# Patient Record
Sex: Male | Born: 1945 | Race: White | Hispanic: No | Marital: Married | Smoking: Never smoker
Health system: Southern US, Community
[De-identification: ages and names within clinical notes are randomized; demographics above are authoritative.]

## PROBLEM LIST (undated history)

## (undated) DIAGNOSIS — M1990 Unspecified osteoarthritis, unspecified site: Secondary | ICD-10-CM

## (undated) DIAGNOSIS — C61 Malignant neoplasm of prostate: Secondary | ICD-10-CM

## (undated) DIAGNOSIS — K219 Gastro-esophageal reflux disease without esophagitis: Secondary | ICD-10-CM

## (undated) DIAGNOSIS — E039 Hypothyroidism, unspecified: Secondary | ICD-10-CM

## (undated) DIAGNOSIS — N401 Enlarged prostate with lower urinary tract symptoms: Secondary | ICD-10-CM

## (undated) DIAGNOSIS — C801 Malignant (primary) neoplasm, unspecified: Secondary | ICD-10-CM

## (undated) DIAGNOSIS — Z973 Presence of spectacles and contact lenses: Secondary | ICD-10-CM

## (undated) DIAGNOSIS — Z77098 Contact with and (suspected) exposure to other hazardous, chiefly nonmedicinal, chemicals: Secondary | ICD-10-CM

## (undated) DIAGNOSIS — I10 Essential (primary) hypertension: Secondary | ICD-10-CM

## (undated) DIAGNOSIS — Z974 Presence of external hearing-aid: Secondary | ICD-10-CM

## (undated) HISTORY — PX: SURGERY SCROTAL / TESTICULAR: SUR1316

## (undated) HISTORY — PX: TONSILLECTOMY: SUR1361

## (undated) HISTORY — PX: COLONOSCOPY: SHX174

---

## 1992-05-26 DIAGNOSIS — Z923 Personal history of irradiation: Secondary | ICD-10-CM

## 1992-05-26 DIAGNOSIS — Z8547 Personal history of malignant neoplasm of testis: Secondary | ICD-10-CM

## 1992-05-26 HISTORY — DX: Personal history of irradiation: Z92.3

## 1992-05-26 HISTORY — PX: RADICAL ORCHIECTOMY: SHX2285

## 1992-05-26 HISTORY — DX: Personal history of malignant neoplasm of testis: Z85.47

## 1999-05-21 ENCOUNTER — Encounter: Payer: Self-pay | Admitting: Family Medicine

## 1999-05-21 ENCOUNTER — Encounter: Admission: RE | Admit: 1999-05-21 | Discharge: 1999-05-21 | Payer: Self-pay | Admitting: Family Medicine

## 2001-06-01 ENCOUNTER — Encounter: Payer: Self-pay | Admitting: Family Medicine

## 2001-06-01 ENCOUNTER — Encounter: Admission: RE | Admit: 2001-06-01 | Discharge: 2001-06-01 | Payer: Self-pay | Admitting: Family Medicine

## 2003-04-05 ENCOUNTER — Encounter: Admission: RE | Admit: 2003-04-05 | Discharge: 2003-04-05 | Payer: Self-pay | Admitting: Family Medicine

## 2005-09-12 ENCOUNTER — Encounter: Admission: RE | Admit: 2005-09-12 | Discharge: 2005-09-12 | Payer: Self-pay | Admitting: Family Medicine

## 2009-11-08 ENCOUNTER — Encounter: Payer: Self-pay | Admitting: Emergency Medicine

## 2009-11-08 ENCOUNTER — Ambulatory Visit: Payer: Self-pay | Admitting: Diagnostic Radiology

## 2010-05-02 ENCOUNTER — Observation Stay (HOSPITAL_COMMUNITY): Admission: EM | Admit: 2010-05-02 | Discharge: 2009-11-09 | Payer: Self-pay

## 2010-08-11 LAB — COMPREHENSIVE METABOLIC PANEL
ALT: 66 U/L — ABNORMAL HIGH (ref 0–53)
AST: 39 U/L — ABNORMAL HIGH (ref 0–37)
Albumin: 4 g/dL (ref 3.5–5.2)
Alkaline Phosphatase: 56 U/L (ref 39–117)
CO2: 26 mEq/L (ref 19–32)
Chloride: 105 mEq/L (ref 96–112)
Creatinine, Ser: 0.8 mg/dL (ref 0.4–1.5)
GFR calc Af Amer: >60 mL/min (ref >60)
GFR calc non Af Amer: >60 mL/min (ref >60)
Potassium: 3.5 mEq/L (ref 3.5–5.1)
Sodium: 143 mEq/L (ref 135–145)
Total Bilirubin: 0.5 mg/dL (ref 0.3–1.2)

## 2010-08-11 LAB — DIFFERENTIAL
Basophils Absolute: 0 10*3/uL (ref 0.0–0.1)
Eosinophils Absolute: 0.1 10*3/uL (ref 0.0–0.7)
Eosinophils Relative: 1 % (ref 0–5)
Lymphocytes Relative: 30 % (ref 12–46)
Monocytes Absolute: 0.6 10*3/uL (ref 0.1–1.0)

## 2010-08-11 LAB — CBC
HCT: 39.8 % (ref 39.0–52.0)
Hemoglobin: 14 g/dL (ref 13.0–17.0)
MCHC: 35 g/dL (ref 30.0–36.0)
MCV: 89.8 fL (ref 78.0–100.0)
Platelets: 151 10*3/uL (ref 150–400)
RBC: 4.44 MIL/uL (ref 4.22–5.81)
RDW: 12.6 % (ref 11.5–15.5)
WBC: 7.3 10*3/uL (ref 4.0–10.5)

## 2011-05-23 IMAGING — CT CT HEAD W/O CM
1 series · 16 of 30 positions shown, 20 images · non-contrast
Comparison: None

CLINICAL DATA: Dizziness, ringing in left ear, hypertension

CT HEAD WITHOUT CONTRAST
TECHNIQUE: Contiguous axial images were obtained from the base of
the skull through the vertex without contrast.

[Series 2: head 4.8 h37s · axial · 0.47mm/px · z∈[-120,+40]mm · 16 of 36 slices shown, 20 images]
[im 2/36  brain]
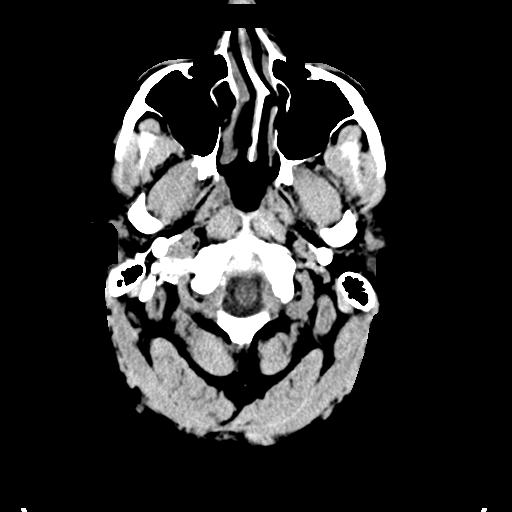
[im 2/36  bone]
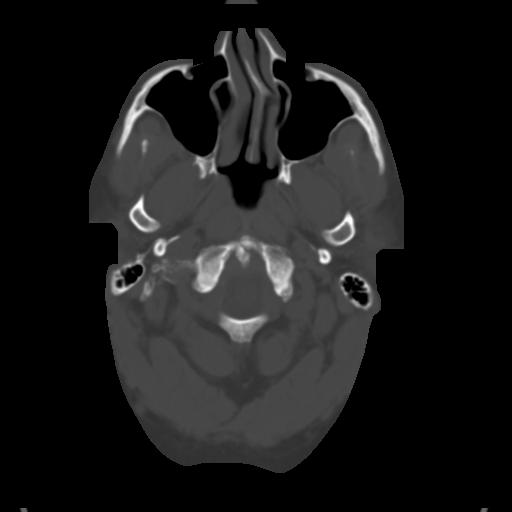
[im 4/36  brain]
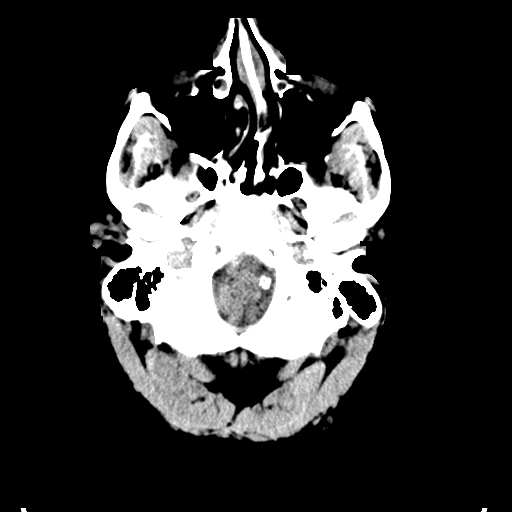
[im 7/36  brain]
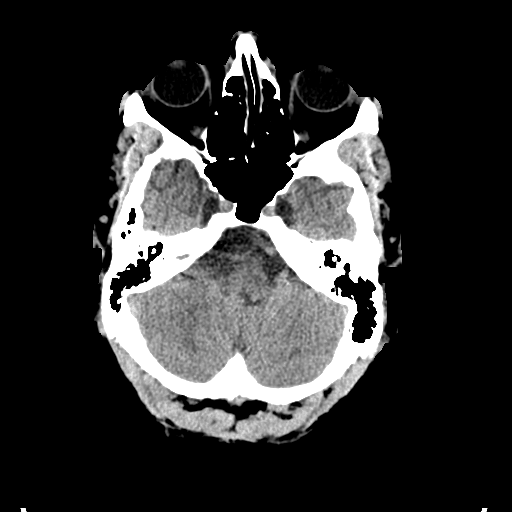
[im 9/36  brain]
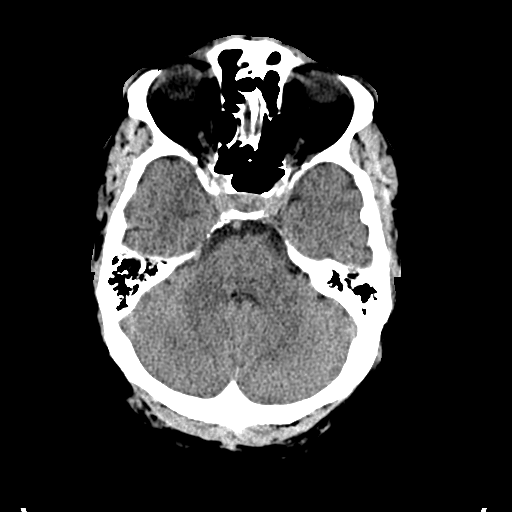
[im 10/36  brain]
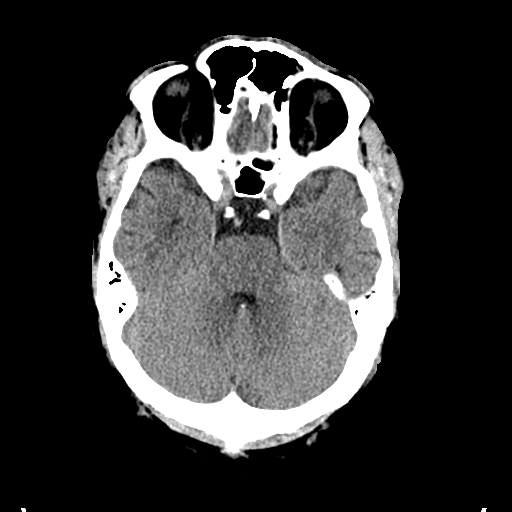
[im 10/36  bone]
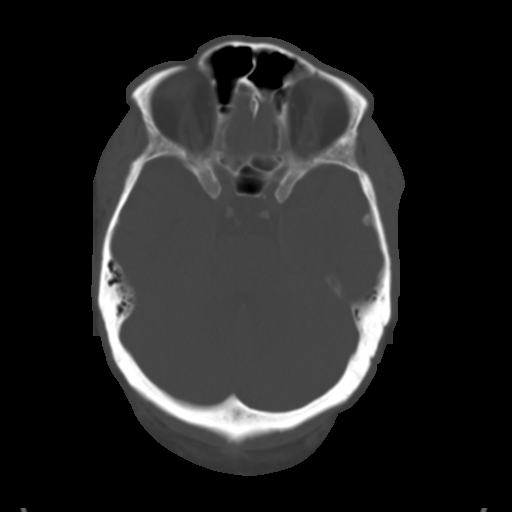
[im 13/36  brain]
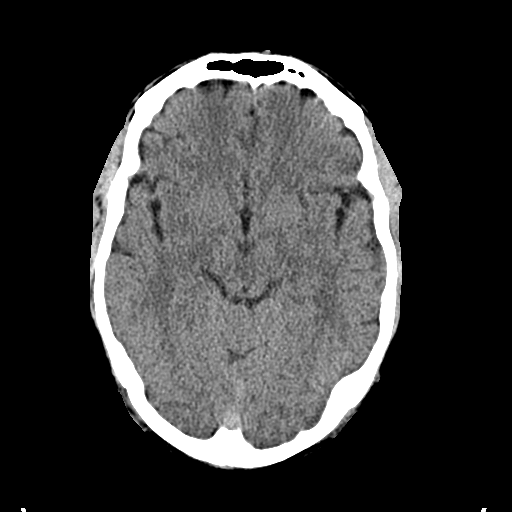
[im 15/36  brain]
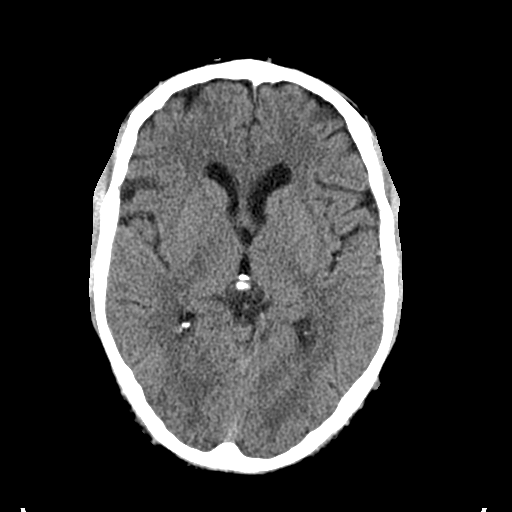
[im 17/36  brain]
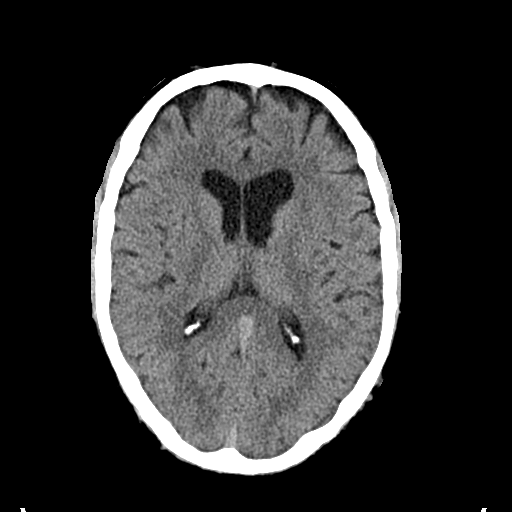
[im 19/36  brain]
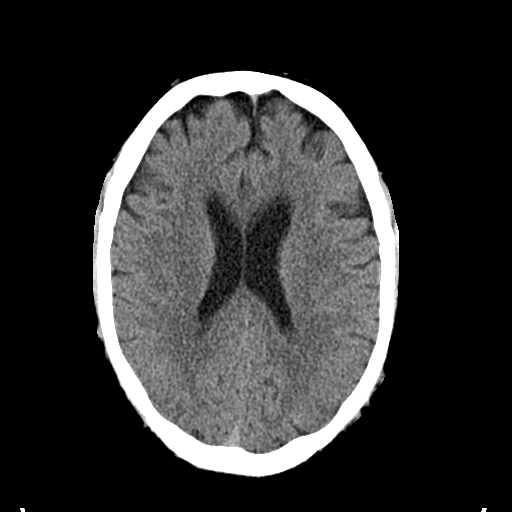
[im 19/36  bone]
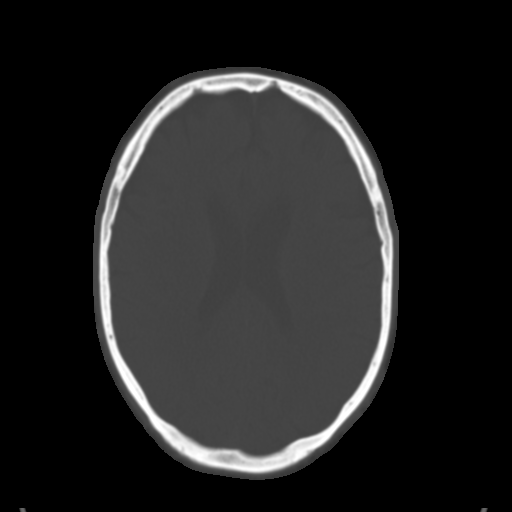
[im 21/36  brain]
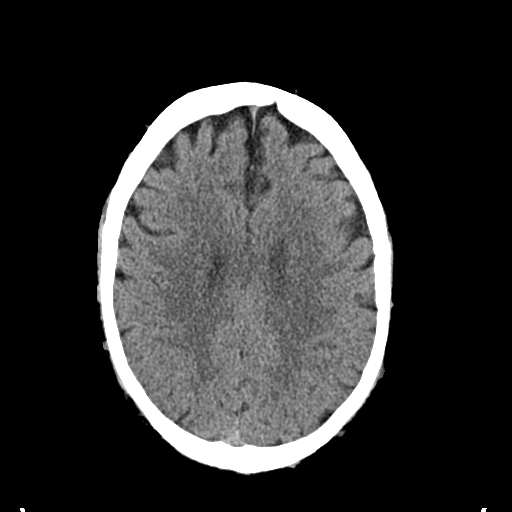
[im 23/36  brain]
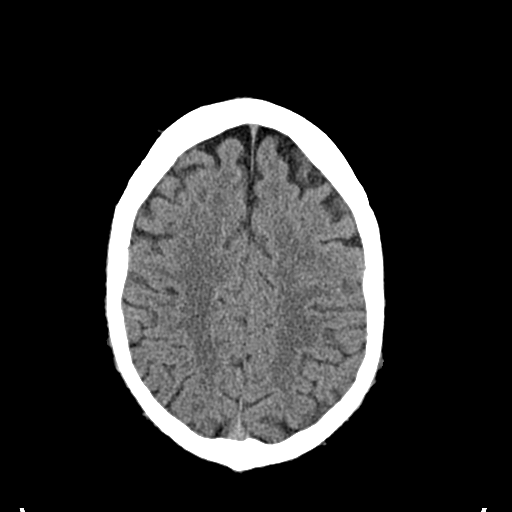
[im 26/36  brain]
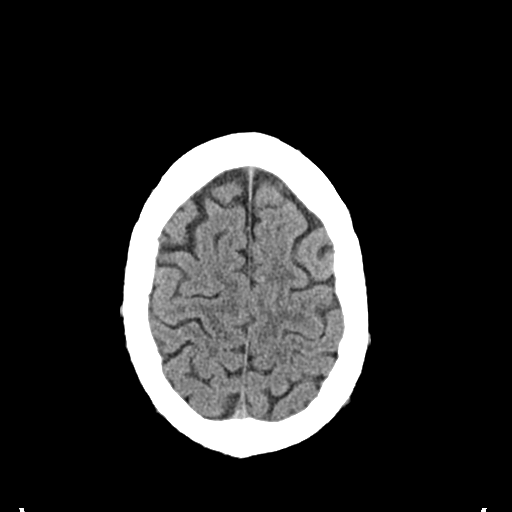
[im 27/36  brain]
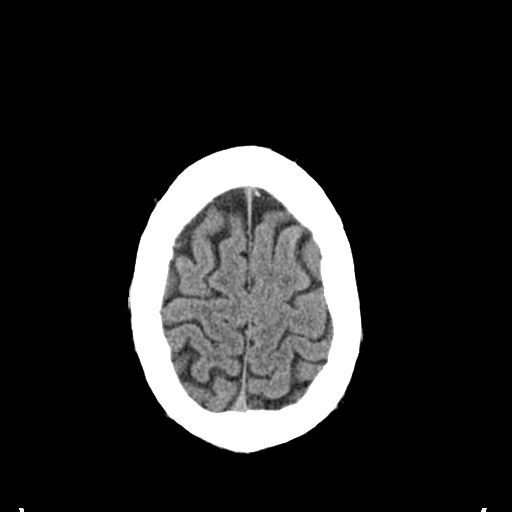
[im 27/36  bone]
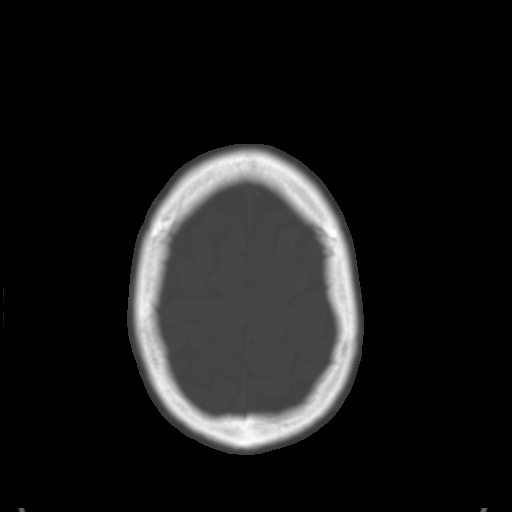
[im 29/36  brain]
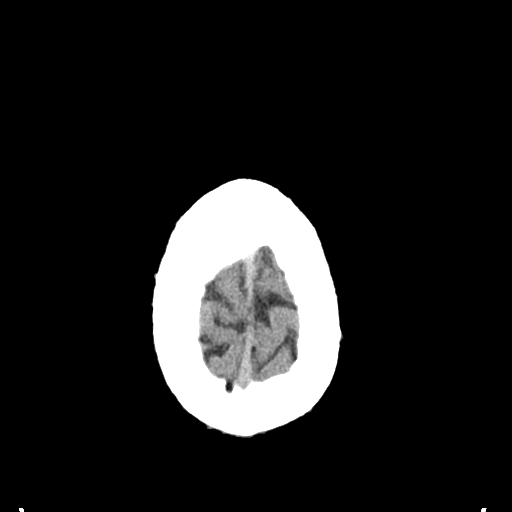
[im 32/36  brain]
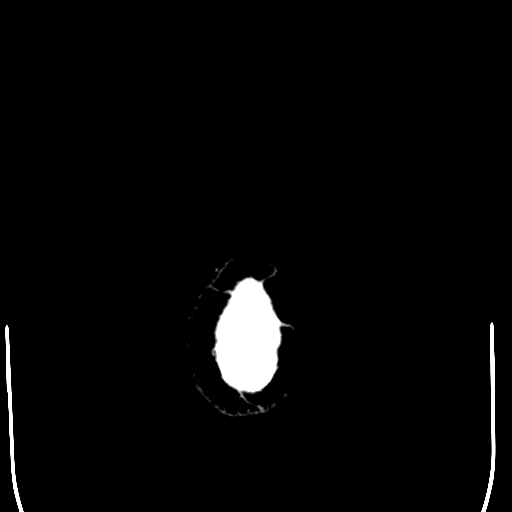
[im 34/36  brain]
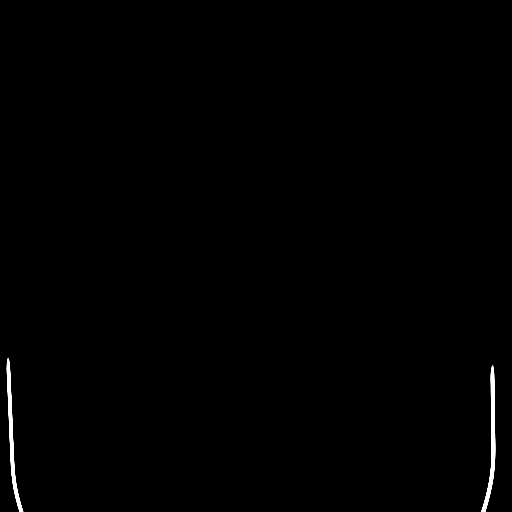

[16 of 30 positions shown; findings below may reference images not displayed]

FINDINGS: Generalized atrophy.
Normal ventricular morphology.
No midline shift or mass effect.
Otherwise normal appearance of brain parenchyma.
No intracranial hemorrhage, mass lesion, or evidence of acute
infarction.
No definite extra-axial fluid collections.
Atherosclerotic calcifications of vertebral arteries at skull base.
Visualized paranasal sinuses and mastoid air cells clear.
No acute bony findings.
IMPRESSION: Generalized atrophy.
No acute intracranial abnormalities.

## 2014-05-26 DIAGNOSIS — Z8711 Personal history of peptic ulcer disease: Secondary | ICD-10-CM

## 2014-05-26 HISTORY — PX: ESOPHAGOGASTRODUODENOSCOPY: SHX1529

## 2014-05-26 HISTORY — DX: Personal history of peptic ulcer disease: Z87.11

## 2019-10-12 DIAGNOSIS — E785 Hyperlipidemia, unspecified: Secondary | ICD-10-CM | POA: Diagnosis present

## 2019-10-12 DIAGNOSIS — Z8711 Personal history of peptic ulcer disease: Secondary | ICD-10-CM

## 2019-10-12 DIAGNOSIS — M1611 Unilateral primary osteoarthritis, right hip: Secondary | ICD-10-CM | POA: Diagnosis present

## 2019-10-12 DIAGNOSIS — M1712 Unilateral primary osteoarthritis, left knee: Secondary | ICD-10-CM | POA: Diagnosis present

## 2019-10-12 DIAGNOSIS — Z8547 Personal history of malignant neoplasm of testis: Secondary | ICD-10-CM

## 2019-10-12 DIAGNOSIS — I10 Essential (primary) hypertension: Secondary | ICD-10-CM | POA: Diagnosis present
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
## 2019-10-19 NOTE — Progress Notes (Addendum)
PCP - Dr. Norvel [facility] w/ surgical clearance dated 10-21-19 on chart. Cardiologist -   Chest x-ray -  EKG -  Stress Test -  ECHO -  Cardiac Cath -   Sleep Study -  CPAP -   Fasting Blood Sugar -  Checks Blood Sugar _____ times a day  Blood Thinner Instructions: Aspirin Instructions: Last Dose:  Pt  has had both of his vaccinations    Anesthesia review:   Patient denies shortness of breath, fever, cough and chest pain at PAT appointment   Patient verbalized understanding of instructions that were given to them at the PAT appointment. Patient was also instructed that they will need to review over the PAT instructions again at home before surgery.

## 2019-10-19 NOTE — Patient Instructions (Addendum)
DUE TO COVID-19 ONLY ONE VISITOR IS ALLOWED TO COME WITH YOU AND STAY IN THE WAITING ROOM ONLY DURING PRE OP AND PROCEDURE DAY OF SURGERY. THE 1 VISITOR MAY VISIT WITH YOU AFTER SURGERY IN YOUR PRIVATE ROOM DURING VISITING HOURS ONLY!  YOU NEED TO HAVE A COVID 19 TEST ON 10-28-19 @ 10:05 AM, THIS TEST MUST BE DONE BEFORE SURGERY, COME  [address], [place] , [id number].  ([place]) ONCE YOUR COVID TEST IS COMPLETED, PLEASE BEGIN THE QUARANTINE INSTRUCTIONS AS OUTLINED IN YOUR HANDOUT.                DONTRA STONEKING  10/19/2019   Your procedure is scheduled on: 11-01-19   Report to [facility] Main  Entrance    Report to Admitting at 5:30 AM     Call this number if you have problems the morning of surgery [phone]    Remember: AFTER MIDNIGHT THE NIGHT PRIOR TO SURGERY. NOTHING BY MOUTH EXCEPT CLEAR LIQUIDS UNTIL 4:30 AM . PLEASE FINISH ENSURE DRINK PER SURGEON ORDER  WHICH NEEDS TO BE COMPLETED AT 4:30 AM .   CLEAR LIQUID DIET   Foods Allowed                                                                     Foods Excluded  Coffee and tea, regular and decaf                             liquids that you cannot  Plain Jell-O any favor except red or purple                                           see through such as: Fruit ices (not with fruit pulp)                                     milk, soups, orange juice  Iced Popsicles                                    All solid food Carbonated beverages, regular and diet                                    Cranberry, grape and apple juices Sports drinks like Gatorade Lightly seasoned clear broth or consume(fat free) Sugar, honey syrup   _____________________________________________________________________      Take these medicines the morning of surgery with A SIP OF WATER: Amlodipine (Norvasc), Levothyroxine (Synthroid), and Metoprolol Succinate   BRUSH YOUR TEETH MORNING OF SURGERY AND RINSE YOUR  MOUTH OUT, NO CHEWING GUM CANDY OR MINTS.                                You may not have any metal on  your body including hair pins and              piercings     Do not wear jewelry, cologne, lotions, powders or deodorant              Men may shave face and neck.   Do not bring valuables to the hospital. [place].  Contacts, dentures or bridgework may not be worn into surgery.  You may bring a small overnight bag     Special Instructions: N/A              Please read over the following fact sheets you were given: _____________________________________________________________________             [facility] - Preparing for Surgery Before surgery, you can play an important role.  Because skin is not sterile, your skin needs to be as free of germs as possible.  You can reduce the number of germs on your skin by washing with CHG (chlorahexidine gluconate) soap before surgery.  CHG is an antiseptic cleaner which kills germs and bonds with the skin to continue killing germs even after washing. Please DO NOT use if you have an allergy to CHG or antibacterial soaps.  If your skin becomes reddened/irritated stop using the CHG and inform your nurse when you arrive at Short Stay. Do not shave (including legs and underarms) for at least 48 hours prior to the first CHG shower.  You may shave your face/neck. Please follow these instructions carefully:  1.  Shower with CHG Soap the night before surgery and the  morning of Surgery.  2.  If you choose to wash your hair, wash your hair first as usual with your  normal  shampoo.  3.  After you shampoo, rinse your hair and body thoroughly to remove the  shampoo.                           4.  Use CHG as you would any other liquid soap.  You can apply chg directly  to the skin and wash                       Gently with a scrungie or clean washcloth.  5.  Apply the CHG Soap to your body ONLY FROM THE NECK  DOWN.   Do not use on face/ open                           Wound or open sores. Avoid contact with eyes, ears mouth and genitals (private parts).                       Wash face,  Genitals (private parts) with your normal soap.             6.  Wash thoroughly, paying special attention to the area where your surgery  will be performed.  7.  Thoroughly rinse your body with warm water from the neck down.  8.  DO NOT shower/wash with your normal soap after using and rinsing off  the CHG Soap.                9.  Pat yourself dry with a clean towel.  10.  Wear clean pajamas.            11.  Place clean sheets on your bed the night of your first shower and do not  sleep with pets. Day of Surgery : Do not apply any lotions/deodorants the morning of surgery.  Please wear clean clothes to the hospital/surgery center.  FAILURE TO FOLLOW THESE INSTRUCTIONS [place] RESULT IN THE CANCELLATION OF YOUR SURGERY PATIENT SIGNATURE_________________________________  NURSE SIGNATURE__________________________________  ________________________________________________________________________   Adam Phenix  An incentive spirometer is a tool that can help keep your lungs clear and active. This tool measures how well you are filling your lungs with each breath. Taking long deep breaths Widjaja help reverse or decrease the chance of developing breathing (pulmonary) problems (especially infection) following:  A long period of time when you are unable to move or be active. BEFORE THE PROCEDURE   If the spirometer includes an indicator to show your best effort, your nurse or respiratory therapist will set it to a desired goal.  If possible, sit up straight or lean slightly forward. Try not to slouch.  Hold the incentive spirometer in an upright position. INSTRUCTIONS FOR USE  1. Sit on the edge of your bed if possible, or sit up as far as you can in bed or on a chair. 2. Hold the incentive spirometer in  an upright position. 3. Breathe out normally. 4. Place the mouthpiece in your mouth and seal your lips tightly around it. 5. Breathe in slowly and as deeply as possible, raising the piston or the ball toward the top of the column. 6. Hold your breath for 3-5 seconds or for as long as possible. Allow the piston or ball to fall to the bottom of the column. 7. Remove the mouthpiece from your mouth and breathe out normally. 8. Rest for a few seconds and repeat Steps 1 through 7 at least 10 times every 1-2 hours when you are awake. Take your time and take a few normal breaths between deep breaths. 9. The spirometer Annunziato include an indicator to show your best effort. Use the indicator as a goal to work toward during each repetition. 10. After each set of 10 deep breaths, practice coughing to be sure your lungs are clear. If you have an incision (the cut made at the time of surgery), support your incision when coughing by placing a pillow or rolled up towels firmly against it. Once you are able to get out of bed, walk around indoors and cough well. You [place] stop using the incentive spirometer when instructed by your caregiver.  RISKS AND COMPLICATIONS  Take your time so you do not get dizzy or light-headed.  If you are in pain, you Sh[place]ido need to take or ask for pain medication before doing incentive spirometry. It is harder to take a deep breath if you are having pain. AFTER USE  Rest and breathe slowly and easily.  It can be helpful to keep track of a log of your progress. Your caregiver can provide you with a simple table to help with this. If you are using the spirometer at home, follow these instructions: Julian IF:   You are having difficultly using the spirometer.  You have trouble using the spirometer as often as instructed.  Your pain medication is not giving enough relief while using the spirometer.  You develop fever of 100.5 F (38.1 C) or higher. SEEK IMMEDIATE MEDICAL CARE  IF:   You cough up bloody  sputum that had not been present before.  You develop fever of 102 F (38.9 C) or greater.  You develop worsening pain at or near the incision site. MAKE SURE YOU:   Understand these instructions.  Will watch your condition.  Will get help right away if you are not doing well or get worse. Document Released: 09/22/2006 Document Revised: 08/04/2011 Document Reviewed: 11/23/2006 [facility] Patient Information 2014 [place], [place].   ________________________________________________________________________

## 2019-10-26 ENCOUNTER — Other Ambulatory Visit: Payer: Self-pay

## 2019-10-26 ENCOUNTER — Encounter (HOSPITAL_COMMUNITY)
Admission: RE | Admit: 2019-10-26 | Discharge: 2019-10-26 | Disposition: A | Discharge status: Home / Self Care | Payer: Medicare Other | Source: Ambulatory Visit | Attending: Orthopedic Surgery | Admitting: Orthopedic Surgery

## 2019-10-26 ENCOUNTER — Encounter (HOSPITAL_COMMUNITY): Payer: Self-pay

## 2019-10-26 DIAGNOSIS — Z01818 Encounter for other preprocedural examination: Secondary | ICD-10-CM | POA: Insufficient documentation

## 2019-10-26 HISTORY — DX: Essential (primary) hypertension: I10

## 2019-10-26 HISTORY — DX: Malignant (primary) neoplasm, unspecified: C80.1

## 2019-10-26 HISTORY — DX: Gastro-esophageal reflux disease without esophagitis: K21.9

## 2019-10-26 HISTORY — DX: Unspecified osteoarthritis, unspecified site: M19.90

## 2019-10-26 LAB — SURGICAL PCR SCREEN
MRSA, PCR: NEGATIVE
Staphylococcus aureus: NEGATIVE

## 2019-10-26 LAB — CBC
HCT: 43.5 % (ref 39.0–52.0)
Hemoglobin: 14.6 g/dL (ref 13.0–17.0)
MCH: 30.9 pg (ref 26.0–34.0)
MCHC: 33.6 g/dL (ref 30.0–36.0)
MCV: 92.2 fL (ref 80.0–100.0)
Platelets: 157 10*3/uL (ref 150–400)
RBC: 4.72 MIL/uL (ref 4.22–5.81)
RDW: 13.9 % (ref 11.5–15.5)
WBC: 6.6 10*3/uL (ref 4.0–10.5)
nRBC: 0 % (ref 0.0–0.2)

## 2019-10-26 LAB — BASIC METABOLIC PANEL
Anion gap: 10 (ref 5–15)
BUN: 18 mg/dL (ref 8–23)
CO2: 26 mmol/L (ref 22–32)
Calcium: 9.3 mg/dL (ref 8.9–10.3)
Chloride: 100 mmol/L (ref 98–111)
Creatinine, Ser: 0.94 mg/dL (ref 0.61–1.24)
GFR calc Af Amer: >60 mL/min (ref >60)
GFR calc non Af Amer: >60 mL/min (ref >60)
Glucose, Bld: 99 mg/dL (ref 70–99)
Potassium: 4.4 mmol/L (ref 3.5–5.1)
Sodium: 136 mmol/L (ref 135–145)

## 2019-10-28 ENCOUNTER — Other Ambulatory Visit (HOSPITAL_COMMUNITY)
Admission: RE | Admit: 2019-10-28 | Discharge: 2019-10-28 | Disposition: A | Discharge status: Home / Self Care | Payer: Medicare Other | Source: Ambulatory Visit | Attending: Orthopedic Surgery | Admitting: Orthopedic Surgery

## 2019-10-28 DIAGNOSIS — Z01812 Encounter for preprocedural laboratory examination: Secondary | ICD-10-CM | POA: Diagnosis present

## 2019-10-28 DIAGNOSIS — Z20822 Contact with and (suspected) exposure to covid-19: Secondary | ICD-10-CM | POA: Insufficient documentation

## 2019-10-29 LAB — SARS CORONAVIRUS 2 (TAT 6-24 HRS): SARS Coronavirus 2: NEGATIVE

## 2019-10-31 MED ORDER — BUPIVACAINE LIPOSOME 1.3 % IJ SUSP
10.0000 mL | Freq: Once | INTRAMUSCULAR | Status: DC
  Filled 2019-10-31: qty 10

## 2019-10-31 NOTE — Anesthesia Preprocedure Evaluation (Addendum)
Anesthesia Evaluation  Patient identified by MRN, date of birth, ID band Patient awake    Reviewed: Allergy & Precautions, NPO status , Patient's Chart, lab work & pertinent test results, reviewed documented beta blocker date and time   Airway Mallampati: II  TM Distance: >3 FB Neck ROM: Full    Dental no notable dental hx. (+) Dental Advisory Given, Teeth Intact   Pulmonary neg pulmonary ROS,    Pulmonary exam normal breath sounds clear to auscultation       Cardiovascular hypertension, Pt. on medications and Pt. on home beta blockers Normal cardiovascular exam Rhythm:Regular Rate:Normal     Neuro/Psych negative neurological ROS  negative psych ROS   GI/Hepatic Neg liver ROS, GERD  ,  Endo/Other  Morbid obesity  Renal/GU negative Renal ROS     Musculoskeletal  (+) Arthritis ,   Abdominal   Peds  Hematology negative hematology ROS (+)   Anesthesia Other Findings   Reproductive/Obstetrics negative OB ROS                            Anesthesia Physical Anesthesia Plan  ASA: III  Anesthesia Plan: Spinal   Post-op Pain Management:    Induction: Intravenous  PONV Risk Score and Plan: 2 and Propofol infusion, TIVA and Treatment may vary due to age or medical condition  Airway Management Planned: Natural Airway  Additional Equipment: None  Intra-op Plan:   Post-operative Plan:   Informed Consent: I have reviewed the patients History and Physical, chart, labs and discussed the procedure including the risks, benefits and alternatives for the proposed anesthesia with the patient or authorized representative who has indicated his/her understanding and acceptance.     Dental advisory given  Plan Discussed with: CRNA  Anesthesia Plan Comments:        Anesthesia Quick Evaluation

## 2019-11-01 ENCOUNTER — Other Ambulatory Visit: Payer: Self-pay

## 2019-11-01 ENCOUNTER — Ambulatory Visit (HOSPITAL_COMMUNITY): Payer: Medicare Other

## 2019-11-01 ENCOUNTER — Encounter (HOSPITAL_COMMUNITY)
Admission: RE | Disposition: A | Discharge status: Home / Self Care | Payer: Self-pay | Source: Home / Self Care | Attending: Orthopedic Surgery

## 2019-11-01 ENCOUNTER — Observation Stay (HOSPITAL_COMMUNITY)
Admission: RE | Admit: 2019-11-01 | Discharge: 2019-11-02 | Disposition: A | Discharge status: Home / Self Care | Payer: Medicare Other | Attending: Orthopedic Surgery | Admitting: Orthopedic Surgery

## 2019-11-01 ENCOUNTER — Ambulatory Visit (HOSPITAL_COMMUNITY): Payer: Medicare Other | Admitting: Anesthesiology

## 2019-11-01 ENCOUNTER — Encounter (HOSPITAL_COMMUNITY): Payer: Self-pay | Admitting: Orthopedic Surgery

## 2019-11-01 DIAGNOSIS — Z79899 Other long term (current) drug therapy: Secondary | ICD-10-CM | POA: Insufficient documentation

## 2019-11-01 DIAGNOSIS — Z419 Encounter for procedure for purposes other than remedying health state, unspecified: Secondary | ICD-10-CM

## 2019-11-01 DIAGNOSIS — M1712 Unilateral primary osteoarthritis, left knee: Secondary | ICD-10-CM | POA: Diagnosis not present

## 2019-11-01 DIAGNOSIS — I10 Essential (primary) hypertension: Secondary | ICD-10-CM | POA: Diagnosis not present

## 2019-11-01 DIAGNOSIS — E785 Hyperlipidemia, unspecified: Secondary | ICD-10-CM | POA: Diagnosis present

## 2019-11-01 DIAGNOSIS — K219 Gastro-esophageal reflux disease without esophagitis: Secondary | ICD-10-CM | POA: Diagnosis not present

## 2019-11-01 DIAGNOSIS — Z8547 Personal history of malignant neoplasm of testis: Secondary | ICD-10-CM | POA: Diagnosis not present

## 2019-11-01 DIAGNOSIS — M1611 Unilateral primary osteoarthritis, right hip: Secondary | ICD-10-CM | POA: Diagnosis present

## 2019-11-01 DIAGNOSIS — Z79890 Hormone replacement therapy (postmenopausal): Secondary | ICD-10-CM | POA: Insufficient documentation

## 2019-11-01 DIAGNOSIS — E039 Hypothyroidism, unspecified: Secondary | ICD-10-CM | POA: Diagnosis not present

## 2019-11-01 DIAGNOSIS — M1610 Unilateral primary osteoarthritis, unspecified hip: Secondary | ICD-10-CM | POA: Diagnosis present

## 2019-11-01 DIAGNOSIS — Z8711 Personal history of peptic ulcer disease: Secondary | ICD-10-CM

## 2019-11-01 DIAGNOSIS — Z6836 Body mass index (BMI) 36.0-36.9, adult: Secondary | ICD-10-CM | POA: Diagnosis not present

## 2019-11-01 HISTORY — PX: TOTAL HIP ARTHROPLASTY: SHX124

## 2019-11-01 HISTORY — PX: INJECTION KNEE: SHX2446

## 2019-11-01 SURGERY — ARTHROPLASTY, HIP, TOTAL, ANTERIOR APPROACH
Anesthesia: Spinal | Site: Knee | Laterality: Right

## 2019-11-01 MED ORDER — MORPHINE SULFATE (PF) 4 MG/ML IV SOLN: 0.5000 mg | INTRAVENOUS | Status: DC | PRN

## 2019-11-01 MED ORDER — BACLOFEN 10 MG PO TABS: 10.0000 mg | ORAL_TABLET | Freq: Three times a day (TID) | ORAL | 0 refills | Status: DC | PRN

## 2019-11-01 MED ORDER — METHYLPREDNISOLONE ACETATE 40 MG/ML IJ SUSP
INTRAMUSCULAR | Status: DC | PRN
  Administered 2019-11-01: 80 mg via INTRA_ARTICULAR

## 2019-11-01 MED ORDER — ORAL CARE MOUTH RINSE: 15.0000 mL | Freq: Once | OROMUCOSAL | Status: AC

## 2019-11-01 MED ORDER — ONDANSETRON HCL 4 MG/2ML IJ SOLN
INTRAMUSCULAR | Status: DC | PRN
  Administered 2019-11-01: 4 mg via INTRAVENOUS

## 2019-11-01 MED ORDER — DEXAMETHASONE SODIUM PHOSPHATE 10 MG/ML IJ SOLN
10.0000 mg | Freq: Once | INTRAMUSCULAR | Status: AC
  Administered 2019-11-02: 10 mg via INTRAVENOUS
  Filled 2019-11-01: qty 1

## 2019-11-01 MED ORDER — CEFAZOLIN SODIUM-DEXTROSE 2-4 GM/100ML-% IV SOLN
2.0000 g | INTRAVENOUS | Status: AC
  Administered 2019-11-01: 2 g via INTRAVENOUS
  Filled 2019-11-01: qty 100

## 2019-11-01 MED ORDER — LACTATED RINGERS IV SOLN: INTRAVENOUS | Status: DC

## 2019-11-01 MED ORDER — POLYETHYLENE GLYCOL 3350 17 G PO PACK: 17.0000 g | PACK | Freq: Every day | ORAL | Status: DC | PRN

## 2019-11-01 MED ORDER — KETOROLAC TROMETHAMINE 15 MG/ML IJ SOLN
INTRAMUSCULAR | Status: AC
  Filled 2019-11-01: qty 1

## 2019-11-01 MED ORDER — MORPHINE SULFATE (PF) 2 MG/ML IV SOLN: 0.5000 mg | INTRAVENOUS | Status: DC | PRN

## 2019-11-01 MED ORDER — BUPIVACAINE HCL (PF) 0.25 % IJ SOLN
INTRAMUSCULAR | Status: DC | PRN
  Administered 2019-11-01: 3 mL via INTRA_ARTICULAR

## 2019-11-01 MED ORDER — PROPOFOL 10 MG/ML IV BOLUS
INTRAVENOUS | Status: DC | PRN
  Administered 2019-11-01: 40 mg via INTRAVENOUS

## 2019-11-01 MED ORDER — ATORVASTATIN CALCIUM 20 MG PO TABS: 20.0000 mg | ORAL_TABLET | ORAL | Status: DC

## 2019-11-01 MED ORDER — ACETAMINOPHEN 500 MG PO TABS
500.0000 mg | ORAL_TABLET | Freq: Four times a day (QID) | ORAL | Status: AC
  Administered 2019-11-01 – 2019-11-02 (×2): 500 mg via ORAL
  Filled 2019-11-01 (×2): qty 1

## 2019-11-01 MED ORDER — METHYLPREDNISOLONE ACETATE 40 MG/ML IJ SUSP
INTRAMUSCULAR | Status: AC
  Filled 2019-11-01: qty 2

## 2019-11-01 MED ORDER — CHLORHEXIDINE GLUCONATE 0.12 % MT SOLN
15.0000 mL | Freq: Once | OROMUCOSAL | Status: AC
  Administered 2019-11-01: 15 mL via OROMUCOSAL

## 2019-11-01 MED ORDER — ACETAMINOPHEN 325 MG PO TABS: 325.0000 mg | ORAL_TABLET | Freq: Four times a day (QID) | ORAL | Status: DC | PRN

## 2019-11-01 MED ORDER — BUPIVACAINE IN DEXTROSE 0.75-8.25 % IT SOLN
INTRATHECAL | Status: DC | PRN
  Administered 2019-11-01: 1.8 mL via INTRATHECAL

## 2019-11-01 MED ORDER — DEXAMETHASONE SODIUM PHOSPHATE 10 MG/ML IJ SOLN
INTRAMUSCULAR | Status: DC | PRN
  Administered 2019-11-01: 8 mg via INTRAVENOUS

## 2019-11-01 MED ORDER — HYDROCODONE-ACETAMINOPHEN 5-325 MG PO TABS
1.0000 | ORAL_TABLET | ORAL | Status: DC | PRN
  Administered 2019-11-01 – 2019-11-02 (×2): 2 via ORAL
  Filled 2019-11-01 (×3): qty 2

## 2019-11-01 MED ORDER — PHENYLEPHRINE HCL (PRESSORS) 10 MG/ML IV SOLN
INTRAVENOUS | Status: AC
  Filled 2019-11-01: qty 1

## 2019-11-01 MED ORDER — HYDROCODONE-ACETAMINOPHEN 5-325 MG PO TABS
ORAL_TABLET | ORAL | Status: AC
  Filled 2019-11-01: qty 1

## 2019-11-01 MED ORDER — ASPIRIN EC 81 MG PO TBEC
81.0000 mg | DELAYED_RELEASE_TABLET | Freq: Two times a day (BID) | ORAL | 0 refills | Status: DC
Start: 2019-11-01 — End: 2019-11-02

## 2019-11-01 MED ORDER — ONDANSETRON HCL 4 MG PO TABS: 4.0000 mg | ORAL_TABLET | Freq: Three times a day (TID) | ORAL | 0 refills | Status: DC | PRN

## 2019-11-01 MED ORDER — POVIDONE-IODINE 10 % EX SWAB
2.0000 "application " | Freq: Once | CUTANEOUS | Status: AC
  Administered 2019-11-01: 2 via TOPICAL

## 2019-11-01 MED ORDER — PHENYLEPHRINE 40 MCG/ML (10ML) SYRINGE FOR IV PUSH (FOR BLOOD PRESSURE SUPPORT)
PREFILLED_SYRINGE | INTRAVENOUS | Status: DC | PRN
  Administered 2019-11-01 (×2): 80 ug via INTRAVENOUS

## 2019-11-01 MED ORDER — LOSARTAN POTASSIUM 50 MG PO TABS
50.0000 mg | ORAL_TABLET | Freq: Every day | ORAL | Status: DC
  Administered 2019-11-01 – 2019-11-02 (×2): 50 mg via ORAL
  Filled 2019-11-01 (×2): qty 1

## 2019-11-01 MED ORDER — PHENYLEPHRINE 40 MCG/ML (10ML) SYRINGE FOR IV PUSH (FOR BLOOD PRESSURE SUPPORT)
PREFILLED_SYRINGE | INTRAVENOUS | Status: AC
  Filled 2019-11-01: qty 10

## 2019-11-01 MED ORDER — SODIUM CHLORIDE (PF) 0.9 % IJ SOLN
INTRAMUSCULAR | Status: AC
  Filled 2019-11-01: qty 20

## 2019-11-01 MED ORDER — ALUM & MAG HYDROXIDE-SIMETH 200-200-20 MG/5ML PO SUSP: 30.0000 mL | ORAL | Status: DC | PRN

## 2019-11-01 MED ORDER — DOCUSATE SODIUM 100 MG PO CAPS
100.0000 mg | ORAL_CAPSULE | Freq: Two times a day (BID) | ORAL | Status: DC
  Administered 2019-11-01 – 2019-11-02 (×2): 100 mg via ORAL
  Filled 2019-11-01 (×2): qty 1

## 2019-11-01 MED ORDER — SODIUM CHLORIDE FLUSH 0.9 % IV SOLN
INTRAVENOUS | Status: DC | PRN
  Administered 2019-11-01: 20 mL via INTRAVENOUS

## 2019-11-01 MED ORDER — METOCLOPRAMIDE HCL 5 MG PO TABS
5.0000 mg | ORAL_TABLET | Freq: Three times a day (TID) | ORAL | Status: DC | PRN
  Filled 2019-11-01: qty 2

## 2019-11-01 MED ORDER — DEXAMETHASONE SODIUM PHOSPHATE 10 MG/ML IJ SOLN
INTRAMUSCULAR | Status: AC
  Filled 2019-11-01: qty 1

## 2019-11-01 MED ORDER — HYDROCODONE-ACETAMINOPHEN 5-325 MG PO TABS: 1.0000 | ORAL_TABLET | Freq: Four times a day (QID) | ORAL | 0 refills | Status: DC | PRN

## 2019-11-01 MED ORDER — PHENOL 1.4 % MT LIQD: 1.0000 | OROMUCOSAL | Status: DC | PRN

## 2019-11-01 MED ORDER — BUPIVACAINE HCL (PF) 0.25 % IJ SOLN
INTRAMUSCULAR | Status: AC
  Filled 2019-11-01: qty 30

## 2019-11-01 MED ORDER — DIPHENHYDRAMINE HCL 12.5 MG/5ML PO ELIX: 12.5000 mg | ORAL_SOLUTION | ORAL | Status: DC | PRN

## 2019-11-01 MED ORDER — HYDROCHLOROTHIAZIDE 25 MG PO TABS
25.0000 mg | ORAL_TABLET | Freq: Every day | ORAL | Status: DC
  Administered 2019-11-02: 25 mg via ORAL
  Filled 2019-11-01: qty 1

## 2019-11-01 MED ORDER — KETOROLAC TROMETHAMINE 15 MG/ML IJ SOLN
7.5000 mg | Freq: Four times a day (QID) | INTRAMUSCULAR | Status: AC
  Administered 2019-11-01 – 2019-11-02 (×4): 7.5 mg via INTRAVENOUS
  Filled 2019-11-01 (×3): qty 1

## 2019-11-01 MED ORDER — GABAPENTIN 100 MG PO CAPS
100.0000 mg | ORAL_CAPSULE | Freq: Three times a day (TID) | ORAL | Status: DC
  Administered 2019-11-01 – 2019-11-02 (×3): 100 mg via ORAL
  Filled 2019-11-01 (×3): qty 1

## 2019-11-01 MED ORDER — SORBITOL 70 % SOLN
30.0000 mL | Freq: Every day | Status: DC | PRN
  Filled 2019-11-01: qty 30

## 2019-11-01 MED ORDER — WATER FOR IRRIGATION, STERILE IR SOLN
Status: DC | PRN
  Administered 2019-11-01 (×2): 1000 mL

## 2019-11-01 MED ORDER — ASPIRIN 81 MG PO CHEW
81.0000 mg | CHEWABLE_TABLET | Freq: Two times a day (BID) | ORAL | Status: DC
  Administered 2019-11-01 – 2019-11-02 (×2): 81 mg via ORAL
  Filled 2019-11-01 (×2): qty 1

## 2019-11-01 MED ORDER — TRANEXAMIC ACID-NACL 1000-0.7 MG/100ML-% IV SOLN
1000.0000 mg | INTRAVENOUS | Status: AC
  Administered 2019-11-01: 1000 mg via INTRAVENOUS
  Filled 2019-11-01: qty 100

## 2019-11-01 MED ORDER — MAGNESIUM CITRATE PO SOLN: 1.0000 | Freq: Once | ORAL | Status: DC | PRN

## 2019-11-01 MED ORDER — PROPOFOL 500 MG/50ML IV EMUL
INTRAVENOUS | Status: DC | PRN
  Administered 2019-11-01: 100 ug/kg/min via INTRAVENOUS

## 2019-11-01 MED ORDER — LACTATED RINGERS IV SOLN: INTRAVENOUS | Status: DC | PRN

## 2019-11-01 MED ORDER — LIDOCAINE 2% (20 MG/ML) 5 ML SYRINGE
INTRAMUSCULAR | Status: AC
  Filled 2019-11-01: qty 5

## 2019-11-01 MED ORDER — ONDANSETRON HCL 4 MG/2ML IJ SOLN
INTRAMUSCULAR | Status: AC
  Filled 2019-11-01: qty 2

## 2019-11-01 MED ORDER — POVIDONE-IODINE 10 % EX SWAB: 2.0000 "application " | Freq: Once | CUTANEOUS | Status: DC

## 2019-11-01 MED ORDER — METOPROLOL SUCCINATE ER 50 MG PO TB24
50.0000 mg | ORAL_TABLET | Freq: Every day | ORAL | Status: DC
  Administered 2019-11-02: 50 mg via ORAL
  Filled 2019-11-01: qty 1

## 2019-11-01 MED ORDER — LEVOTHYROXINE SODIUM 112 MCG PO TABS
112.0000 ug | ORAL_TABLET | Freq: Every day | ORAL | Status: DC
  Administered 2019-11-02: 112 ug via ORAL
  Filled 2019-11-01: qty 1

## 2019-11-01 MED ORDER — HYDROCODONE-ACETAMINOPHEN 5-325 MG PO TABS
ORAL_TABLET | ORAL | Status: AC
  Administered 2019-11-01: 2 via ORAL
  Filled 2019-11-01: qty 1

## 2019-11-01 MED ORDER — ACETAMINOPHEN 500 MG PO TABS
1000.0000 mg | ORAL_TABLET | Freq: Once | ORAL | Status: AC
  Administered 2019-11-01: 1000 mg via ORAL
  Filled 2019-11-01: qty 2

## 2019-11-01 MED ORDER — 0.9 % SODIUM CHLORIDE (POUR BTL) OPTIME
TOPICAL | Status: DC | PRN
  Administered 2019-11-01: 1000 mL

## 2019-11-01 MED ORDER — AMLODIPINE BESYLATE 10 MG PO TABS
10.0000 mg | ORAL_TABLET | Freq: Every day | ORAL | Status: DC
  Administered 2019-11-02: 10 mg via ORAL
  Filled 2019-11-01: qty 1

## 2019-11-01 MED ORDER — OMEPRAZOLE 20 MG PO CPDR: 20.0000 mg | DELAYED_RELEASE_CAPSULE | Freq: Every day | ORAL | 0 refills | Status: DC

## 2019-11-01 MED ORDER — CEFAZOLIN SODIUM-DEXTROSE 1-4 GM/50ML-% IV SOLN
1.0000 g | Freq: Four times a day (QID) | INTRAVENOUS | Status: AC
  Administered 2019-11-01 (×2): 1 g via INTRAVENOUS
  Filled 2019-11-01 (×2): qty 50

## 2019-11-01 MED ORDER — GABAPENTIN 100 MG PO CAPS: 100.0000 mg | ORAL_CAPSULE | Freq: Two times a day (BID) | ORAL | 0 refills | Status: DC | PRN

## 2019-11-01 MED ORDER — EPHEDRINE 5 MG/ML INJ
INTRAVENOUS | Status: AC
  Filled 2019-11-01: qty 10

## 2019-11-01 MED ORDER — ONDANSETRON HCL 4 MG PO TABS
4.0000 mg | ORAL_TABLET | Freq: Four times a day (QID) | ORAL | Status: DC | PRN
  Filled 2019-11-01: qty 1

## 2019-11-01 MED ORDER — LIDOCAINE HCL (CARDIAC) PF 100 MG/5ML IV SOSY
PREFILLED_SYRINGE | INTRAVENOUS | Status: DC | PRN
  Administered 2019-11-01: 80 mg via INTRAVENOUS

## 2019-11-01 MED ORDER — EPHEDRINE SULFATE-NACL 50-0.9 MG/10ML-% IV SOSY
PREFILLED_SYRINGE | INTRAVENOUS | Status: DC | PRN
  Administered 2019-11-01 (×5): 10 mg via INTRAVENOUS

## 2019-11-01 MED ORDER — ONDANSETRON HCL 4 MG/2ML IJ SOLN: 4.0000 mg | Freq: Four times a day (QID) | INTRAMUSCULAR | Status: DC | PRN

## 2019-11-01 MED ORDER — FENTANYL CITRATE (PF) 100 MCG/2ML IJ SOLN
INTRAMUSCULAR | Status: AC
  Filled 2019-11-01: qty 2

## 2019-11-01 MED ORDER — BUPIVACAINE LIPOSOME 1.3 % IJ SUSP
INTRAMUSCULAR | Status: DC | PRN
  Administered 2019-11-01: 10 mL

## 2019-11-01 MED ORDER — MENTHOL 3 MG MT LOZG: 1.0000 | LOZENGE | OROMUCOSAL | Status: DC | PRN

## 2019-11-01 MED ORDER — TRANEXAMIC ACID-NACL 1000-0.7 MG/100ML-% IV SOLN
1000.0000 mg | Freq: Once | INTRAVENOUS | Status: AC
  Administered 2019-11-01: 1000 mg via INTRAVENOUS
  Filled 2019-11-01: qty 100

## 2019-11-01 MED ORDER — PROPOFOL 1000 MG/100ML IV EMUL
INTRAVENOUS | Status: AC
  Filled 2019-11-01: qty 100

## 2019-11-01 MED ORDER — FENTANYL CITRATE (PF) 100 MCG/2ML IJ SOLN
INTRAMUSCULAR | Status: DC | PRN
  Administered 2019-11-01: 100 ug via INTRAVENOUS

## 2019-11-01 MED ORDER — METOCLOPRAMIDE HCL 5 MG/ML IJ SOLN: 5.0000 mg | Freq: Three times a day (TID) | INTRAMUSCULAR | Status: DC | PRN

## 2019-11-01 MED ORDER — PANTOPRAZOLE SODIUM 40 MG PO TBEC
40.0000 mg | DELAYED_RELEASE_TABLET | Freq: Every day | ORAL | Status: DC
  Administered 2019-11-01 – 2019-11-02 (×2): 40 mg via ORAL
  Filled 2019-11-01 (×2): qty 1

## 2019-11-01 SURGICAL SUPPLY — 45 items
APL PRP STRL LF DISP 70% ISPRP (MISCELLANEOUS) ×2
BAG SPEC THK2 15X12 ZIP CLS (MISCELLANEOUS)
BAG ZIPLOCK 12X15 (MISCELLANEOUS) IMPLANT
BLADE SAG 18X100X1.27 (BLADE) ×3 IMPLANT
BLADE SURG SZ10 CARB STEEL (BLADE) ×6 IMPLANT
CHLORAPREP W/TINT 26 (MISCELLANEOUS) ×3 IMPLANT
CLSR STERI-STRIP ANTIMIC 1/2X4 (GAUZE/BANDAGES/DRESSINGS) ×3 IMPLANT
COVER PERINEAL POST (MISCELLANEOUS) ×3 IMPLANT
COVER SURGICAL LIGHT HANDLE (MISCELLANEOUS) ×3 IMPLANT
COVER WAND RF STERILE (DRAPES) IMPLANT
DECANTER SPIKE VIAL GLASS SM (MISCELLANEOUS) ×6 IMPLANT
DRAPE IMP U-DRAPE 54X76 (DRAPES) ×3 IMPLANT
DRAPE STERI IOBAN 125X83 (DRAPES) ×3 IMPLANT
DRAPE U-SHAPE 47X51 STRL (DRAPES) ×6 IMPLANT
DRSG MEPILEX BORDER 4X8 (GAUZE/BANDAGES/DRESSINGS) ×3 IMPLANT
ELECT BLADE TIP CTD 4 INCH (ELECTRODE) ×3 IMPLANT
ELECT REM PT RETURN 15FT ADLT (MISCELLANEOUS) ×3 IMPLANT
GLOVE BIO SURGEON STRL SZ7.5 (GLOVE) ×6 IMPLANT
GLOVE BIOGEL PI IND STRL 8 (GLOVE) ×4 IMPLANT
GLOVE BIOGEL PI INDICATOR 8 (GLOVE) ×2
GOWN STRL REUS W/TWL LRG LVL3 (GOWN DISPOSABLE) ×3 IMPLANT
GOWN STRL REUS W/TWL XL LVL3 (GOWN DISPOSABLE) ×3 IMPLANT
HEAD BIOLOX HIP 36/-2.5 (Joint) IMPLANT
HIP BIOLOX HD 36/-2.5 (Joint) ×3 IMPLANT
HOLDER FOLEY CATH W/STRAP (MISCELLANEOUS) IMPLANT
INSERT 0 DEG POLY  36 F (Miscellaneous) ×3 IMPLANT
INSERT 0 DEG POLY 36 F (Miscellaneous) IMPLANT
KIT TURNOVER KIT A (KITS) IMPLANT
MANIFOLD NEPTUNE II (INSTRUMENTS) ×3 IMPLANT
NS IRRIG 1000ML POUR BTL (IV SOLUTION) ×3 IMPLANT
PACK ANTERIOR HIP CUSTOM (KITS) ×3 IMPLANT
PROTECTOR NERVE ULNAR (MISCELLANEOUS) ×3 IMPLANT
SCREW HEX LP 6.5X20 (Screw) ×1 IMPLANT
SHELL TRIDENT II CLUST SZ 56MM (Shell) ×1 IMPLANT
STEM HIP 4 127DEG (Stem) ×1 IMPLANT
SUT MNCRL AB 4-0 PS2 18 (SUTURE) ×3 IMPLANT
SUT STRATAFIX 0 PDS 27 VIOLET (SUTURE) ×3
SUT VIC AB 0 CT1 36 (SUTURE) ×3 IMPLANT
SUT VIC AB 1 CT1 36 (SUTURE) ×3 IMPLANT
SUT VIC AB 2-0 CT1 27 (SUTURE) ×6
SUT VIC AB 2-0 CT1 TAPERPNT 27 (SUTURE) ×4 IMPLANT
SUTURE STRATFX 0 PDS 27 VIOLET (SUTURE) ×2 IMPLANT
TRAY FOLEY MTR SLVR 16FR STAT (SET/KITS/TRAYS/PACK) IMPLANT
WATER STERILE IRR 1000ML POUR (IV SOLUTION) ×6 IMPLANT
YANKAUER SUCT BULB TIP 10FT TU (MISCELLANEOUS) ×3 IMPLANT

## 2019-11-01 NOTE — Anesthesia Postprocedure Evaluation (Signed)
Anesthesia Post Note  Patient: Joshua [name]  Procedure(s) Performed: TOTAL HIP ARTHROPLASTY ANTERIOR APPROACH (Right Hip) KNEE INJECTION (Left Knee)     Patient location during evaluation: PACU Anesthesia Type: Spinal Level of consciousness: awake and alert Pain management: pain level controlled Vital Signs Assessment: post-procedure vital signs reviewed and stable Respiratory status: spontaneous breathing Cardiovascular status: stable Anesthetic complications: no    Last Vitals:  Vitals:   11/01/19 1258 11/01/19 1355  BP: 140/82 133/82  Pulse: 68 67  Resp: 15 16  Temp: 36.6 C (!) 36.4 C  SpO2: 99% 97%    Last Pain:  Vitals:   11/01/19 1301  TempSrc:   PainSc: [place]

## 2019-11-01 NOTE — Op Note (Signed)
11/01/2019  7:23 AM  PATIENT:  Joshua [name]   MRN: [id number]  PRE-OPERATIVE DIAGNOSIS:  OA RIGHT HIP  POST-OPERATIVE DIAGNOSIS:  OA RIGHT HIP  PROCEDURE:  Procedure(s): TOTAL HIP ARTHROPLASTY ANTERIOR APPROACH  PREOPERATIVE INDICATIONS:    HUIE GHUMAN is an 74 y.o. male who has a diagnosis of Primary osteoarthritis of right hip and elected for surgical management after failing conservative treatment.  The risks benefits and alternatives were discussed with the patient including but not limited to the risks of nonoperative treatment, versus surgical intervention including infection, bleeding, nerve injury, periprosthetic fracture, the need for revision surgery, dislocation, leg length discrepancy, blood clots, cardiopulmonary complications, morbidity, mortality, among others, and they were willing to proceed.     OPERATIVE REPORT     SURGEON:   Renette Butters, MD    ASSISTANT:  Roxan Hockey, PA-C, he was present and scrubbed throughout the case, critical for completion in a timely fashion, and for retraction, instrumentation, and closure.     ANESTHESIA:  General    COMPLICATIONS:  None.     COMPONENTS:  Stryker acolade fit femur size 4 with a 36 mm -2.5 head ball and an acetabular shell size 56 with a  polyethylene liner    PROCEDURE IN DETAIL:   The patient was met in the holding area and  identified.  The appropriate hip was identified and marked at the operative site.  The patient was then transported to the OR  and  placed under anesthesia per that record.  At that point, the patient was  placed in the supine position and  secured to the operating room table and all bony prominences padded. He received pre-operative antibiotics  The Left knee was prepped and I performed an intra0articular injection of 64m of depomedrol. bandaid was placed.     The operative lower extremity was prepped from the iliac crest to the distal leg.  Sterile draping was performed.  Time  out was performed prior to incision.      Skin incision was made just 2 cm lateral to the ASIS  extending in line with the tensor fascia lata. Electrocautery was used to control all bleeders. I dissected down sharply to the fascia of the tensor fascia lata was confirmed that the muscle fibers beneath were running posteriorly. I then incised the fascia over the superficial tensor fascia lata in line with the incision. The fascia was elevated off the anterior aspect of the muscle the muscle was retracted posteriorly and protected throughout the case. I then used electrocautery to incise the tensor fascia lata fascia control and all bleeders. Immediately visible was the fat over top of the anterior neck and capsule.  I removed the anterior fat from the capsule and elevated the rectus muscle off of the anterior capsule. I then removed a large time of capsule. The retractors were then placed over the anterior acetabulum as well as around the superior and inferior neck.  I then made a femoral neck cut. Then used the power corkscrew to remove the femoral head from the acetabulum and thoroughly irrigated the acetabulum. I sized the femoral head.    I then exposed the deep acetabulum, cleared out any tissue including the ligamentum teres.   After adequate visualization, I excised the labrum, and then sequentially reamed.  I then impacted the acetabular implant into place using fluoroscopy for guidance.  Appropriate version and inclination was confirmed clinically matching their bony anatomy, and with fluoroscopy.  I placed a  20 mm screw in the posterior/superio position with an excellent bite.    I then placed the polyethylene liner in place  I then adducted the leg and released the external rotators from the posterior femur allowing it to be easily delivered up lateral and anterior to the acetabulum for preparation of the femoral canal.    I then prepared the proximal femur using the cookie-cutter and then  sequentially reamed and broached.  A trial broach, neck, and head was utilized, and I reduced the hip and used floroscopy to assess the neck length and femoral implant.  I then impacted the femoral prosthesis into place into the appropriate version. The hip was then reduced and fluoroscopy confirmed appropriate position. Leg lengths were restored.  I then irrigated the hip copiously again with, and repaired the fascia with Vicryl, followed by monocryl for the subcutaneous tissue, Monocryl for the skin, Steri-Strips and sterile gauze. The patient was then awakened and returned to PACU in stable and satisfactory condition. There were no complications.  POST OPERATIVE PLAN: WBAT, DVT px: SCD's/TED, ambulation and chemical dvt px  Edmonia Lynch, MD Orthopedic Surgeon [phone]

## 2019-11-01 NOTE — Transfer of Care (Signed)
Immediate Anesthesia Transfer of Care Note  Patient: Joshua [name]  Procedure(s) Performed: TOTAL HIP ARTHROPLASTY ANTERIOR APPROACH (Right Hip) KNEE INJECTION (Left Knee)  Patient Location: PACU  Anesthesia Type:Spinal  Level of Consciousness: awake, alert , oriented and patient cooperative  Airway & Oxygen Therapy: Patient Spontanous Breathing and Patient connected to face mask oxygen  Post-op Assessment: Report given to RN and Post -op Vital signs reviewed and stable  Post vital signs: Reviewed and stable  Last Vitals:  Vitals Value Taken Time  BP    Temp 36.5 C 11/01/19 0937  Pulse 74 11/01/19 0938  Resp 22 11/01/19 0938  SpO2 99 % 11/01/19 0938    Last Pain:  Vitals:   11/01/19 0548  TempSrc: Oral         Complications: No apparent anesthesia complications

## 2019-11-01 NOTE — Discharge Instructions (Signed)

## 2019-11-01 NOTE — Interval H&P Note (Signed)
I participated in the care of this patient and agree with the above history, physical and evaluation. I performed a review of the history and a physical exam as detailed   Tymber Stallings Cyris Safwan Tomei MD  

## 2019-11-01 NOTE — Anesthesia Procedure Notes (Signed)
Spinal  Patient location during procedure: OR End time: 11/01/2019 7:25 AM Staffing Performed: resident/CRNA  Resident/CRNA: Caryl Pina T, CRNA Preanesthetic Checklist Completed: patient identified, IV checked, site marked, risks and benefits discussed, surgical consent, monitors and equipment checked, pre-op evaluation and timeout performed Spinal Block Patient position: sitting Prep: DuraPrep Patient monitoring: heart rate, cardiac monitor, continuous pulse ox and blood pressure Approach: midline Location: L3-4 Injection technique: single-shot Needle Needle type: Pencan  Needle gauge: 24 G Needle length: 9 cm Assessment Sensory level: T4 Additional Notes Expiration date of kit checked and confirmed. Patient tolerated procedure well, without complications.

## 2019-11-01 NOTE — Evaluation (Signed)
Physical Therapy Evaluation Patient Details Name: Joshua [name] MRN: [id number] DOB: January 20, 1946 Today's Date: 11/01/2019   History of Present Illness  Patient is 74 y.o. ale s/p Rt THA anterior approach on 11/01/19 with PMH significant for HTN, GERD, OA, testicular cancer.  Clinical Impression  Joshua [name] is a 74 y.o. male POD 0 s/p Rt THA. Patient reports independence with mobility at baseline. Patient is now limited by functional impairments (see PT problem list below) and requires min assist for transfers and gait with RW. Patient was able to ambulate ~70 feet with RW and min assist. Patient instructed in exercise to facilitate ROM and circulation. Patient will benefit from continued skilled PT interventions to address impairments and progress towards PLOF. Acute PT will follow to progress mobility and stair training in preparation for safe discharge home.     Follow Up Recommendations Follow surgeons recommendation for DC plan and follow-up therapies;Outpatient PT    Equipment Recommendations  Rolling walker with 5" wheels    Recommendations for Other Services       Precautions / Restrictions Precautions Precautions: Fall Restrictions Weight Bearing Restrictions: No Other Position/Activity Restrictions: WBAT      Mobility  Bed Mobility Overal bed mobility: Needs Assistance Bed Mobility: Supine to Sit     Supine to sit: Min assist;HOB elevated     General bed mobility comments: verbal cues to use bed rail, assist for Rt LE mobility to move to EOB. pt required extra time/effort to raise trunk.  Transfers Overall transfer level: Needs assistance Equipment used: Rolling walker (2 wheeled) Transfers: Sit to/from Stand Sit to Stand: Min assist         General transfer comment: cues for hand placement/technique with RW. pt relying on Rt LE for power up more than Lt due to limited knee flexion on Lt. Assist to complete rise and  steady.  Ambulation/Gait Ambulation/Gait assistance: Min assist;Min guard Gait Distance (Feet): 70 Feet Assistive device: Rolling walker (2 wheeled) Gait Pattern/deviations: Step-to pattern;Decreased stride length Gait velocity: decreased   General Gait Details: cues for step pattern and proximity to RW, intermittent assist for walker positioning. pt maintained saf proximity during turn.   Stairs            Wheelchair Mobility    Modified Rankin (Stroke Patients Only)       Balance Overall balance assessment: Needs assistance Sitting-balance support: Feet supported Sitting balance-Leahy Scale: Good     Standing balance support: During functional activity;Bilateral upper extremity supported Standing balance-Leahy Scale: Fair              Pertinent Vitals/Pain Pain Assessment: 0-10 Pain Score: 4  Pain Location: Rt hip Pain Descriptors / Indicators: Aching;Discomfort Pain Intervention(s): Limited activity within patient's tolerance;Monitored during session;Repositioned;Ice applied    Home Living Family/patient expects to be discharged to:: Private residence Living Arrangements: Spouse/significant other Available Help at Discharge: Family Type of Home: House Home Access: Stairs to enter Entrance Stairs-Rails: None Entrance Stairs-Number of Steps: 2 Home Layout: Multi-level;Laundry or work area in basement;Able to live on main level with bedroom/bathroom Home Equipment: Kasandra Knudsen - single point      Prior Function Level of Independence: Independent         Comments: pt uses SPC occasionally in last 2 months due to pain increase in Lt knee and Rt hip. Pt enjoy racing cars as a hobby and as done so for years.      Hand Dominance   Dominant Hand: Right  Extremity/Trunk Assessment   Upper Extremity Assessment Upper Extremity Assessment: Overall WFL for tasks assessed    Lower Extremity Assessment Lower Extremity Assessment: Overall WFL for tasks  assessed    Cervical / Trunk Assessment Cervical / Trunk Assessment: Normal  Communication   Communication: HOH  Cognition Arousal/Alertness: Awake/alert Behavior During Therapy: WFL for tasks assessed/performed Overall Cognitive Status: Within Functional Limits for tasks assessed             General Comments      Exercises Total Joint Exercises Ankle Circles/Pumps: AROM;20 reps;Both;Seated Quad Sets: AROM;Right;10 reps;Seated Heel Slides: Right;5 reps;[facility]   Assessment/Plan    PT Assessment Patient needs continued PT services  PT Problem List Decreased strength;Decreased range of motion;Decreased activity tolerance;Decreased balance;Decreased mobility;Decreased knowledge of use of DME;Decreased knowledge of precautions       PT Treatment Interventions DME instruction;Gait training;Stair training;Therapeutic activities;Functional mobility training;Therapeutic exercise;Balance training;Patient/family education    PT Goals (Current goals can be found in the Care Plan section)  Acute Rehab PT Goals Patient Stated Goal: get home and recover to then get L knee replaced PT Goal Formulation: With patient Time For Goal Achievement: 11/08/19 Potential to Achieve Goals: Good    Frequency 7X/week    AM-PAC PT "6 Clicks" Mobility  Outcome Measure Help needed turning from your back to your side while in a flat bed without using bedrails?: A Little Help needed moving from lying on your back to sitting on the side of a flat bed without using bedrails?: A Little Help needed moving to and from a bed to a chair (including a wheelchair)?: A Little Help needed standing up from a chair using your arms (e.g., wheelchair or bedside chair)?: A Little Help needed to walk in hospital room?: A Little Help needed climbing 3-5 steps with a railing? : A Little 6 Click Score: 18    End of Session Equipment Utilized During Treatment: Gait belt Activity Tolerance: Patient tolerated  treatment well Patient left: in chair;with call bell/phone within reach;with chair alarm set Nurse Communication: Mobility status PT Visit Diagnosis: Muscle weakness (generalized) (M62.81);Difficulty in walking, not elsewhere classified (R26.2)    Time: 2035-5974 PT Time Calculation (min) (ACUTE ONLY): 31 min   Charges:   PT Evaluation $PT Eval Low Complexity: 1 Low PT Treatments $Therapeutic Exercise: 8-22 mins       Verner Mould, DPT Physical Therapist with [facility] [phone]  11/01/2019 1:59 PM

## 2019-11-02 DIAGNOSIS — M1611 Unilateral primary osteoarthritis, right hip: Secondary | ICD-10-CM | POA: Diagnosis not present

## 2019-11-02 MED ORDER — OMEPRAZOLE 20 MG PO CPDR: 20.0000 mg | DELAYED_RELEASE_CAPSULE | Freq: Every day | ORAL | 0 refills | Status: DC

## 2019-11-02 MED ORDER — GABAPENTIN 100 MG PO CAPS
100.0000 mg | ORAL_CAPSULE | Freq: Two times a day (BID) | ORAL | 0 refills | Status: DC | PRN
Start: 2019-11-02 — End: 2020-07-10

## 2019-11-02 MED ORDER — HYDROCODONE-ACETAMINOPHEN 5-325 MG PO TABS: 1.0000 | ORAL_TABLET | Freq: Four times a day (QID) | ORAL | 0 refills | Status: DC | PRN

## 2019-11-02 MED ORDER — ONDANSETRON HCL 4 MG PO TABS: 4.0000 mg | ORAL_TABLET | Freq: Three times a day (TID) | ORAL | 0 refills | Status: DC | PRN

## 2019-11-02 MED ORDER — ASPIRIN EC 81 MG PO TBEC
81.0000 mg | DELAYED_RELEASE_TABLET | Freq: Two times a day (BID) | ORAL | 0 refills | Status: DC
Start: 2019-11-02 — End: 2019-12-29

## 2019-11-02 MED ORDER — BACLOFEN 10 MG PO TABS
10.0000 mg | ORAL_TABLET | Freq: Three times a day (TID) | ORAL | 0 refills | Status: DC | PRN
Start: 2019-11-02 — End: 2019-12-29

## 2019-11-02 NOTE — Progress Notes (Signed)
° ° °  Subjective: Patient reports pain as mild.  Tolerating diet.  Urinating.  Good early mobilization.  Objective:   VITALS:   Vitals:   11/01/19 1742 11/01/19 2300 11/02/19 0221 11/02/19 0642  BP: (!) 155/96 130/74 120/66 120/66  Pulse: 75 78 72 67  Resp: 14 15 12 18   Temp: 97.6 F (36.4 C) 98 F (36.7 C) 98 F (36.7 C) 97.7 F (36.5 C)  TempSrc:  Oral Oral   SpO2: 97% 97% 97% 98%  Weight:      Height:       CBC Latest Ref Rng & Units 10/26/2019 11/08/2009  WBC 4.0 - 10.5 K/uL 6.6 7.3  Hemoglobin 13.0 - 17.0 g/dL 14.6 14.0  Hematocrit 39.0 - 52.0 % 43.5 39.8  Platelets 150 - 400 K/uL 157 151   BMP Latest Ref Rng & Units 10/26/2019 11/08/2009  Glucose 70 - 99 mg/dL 99 96  BUN 8 - 23 mg/dL 18 17  Creatinine 0.61 - 1.24 mg/dL 0.94 0.8  Sodium 135 - 145 mmol/L 136 143  Potassium 3.5 - 5.1 mmol/L 4.4 3.5  Chloride 98 - 111 mmol/L 100 105  CO2 22 - 32 mmol/L 26 26  Calcium 8.9 - 10.3 mg/dL 9.3 9.3   Intake/Output      06/08 0701 - 06/09 0700 06/09 0701 - 06/10 0700   P.O. 780    I.V. (mL/kg) 3727.1 (32)    IV Piggyback 400    Total Intake(mL/kg) 4907.1 (42.2)    Urine (mL/kg/hr) 2650 (0.9)    Blood 350    Total Output 3000    Net +1907.1            Physical Exam: General: NAD.  Upright in bed.  Calm, conversant. Resp: No increased wob Cardio: regular rate and rhythm ABD soft Neurologically intact MSK RLE: Neurovascularly intact Sensation intact distally Feet warm Dorsiflexion/Plantar flexion intact Incision: dressing C/D/I   Assessment: 1 Day Post-Op  S/P Procedure(s) (LRB): TOTAL HIP ARTHROPLASTY ANTERIOR APPROACH (Right) KNEE INJECTION (Left) by Dr. Ernesta Amble. Percell Miller on 11/01/2019  Principal Problem:   Primary osteoarthritis of right hip Active Problems:   Primary osteoarthritis of left knee   History of bleeding ulcers   History of testicular cancer   HTN (hypertension)   HLD (hyperlipidemia)   Primary osteoarthritis of hip   Primary  osteoarthritis, status post total hip arthroplasty Doing well postop day 1 Tolerating diet and voiding Pain controlled Mobilizing well  Plan: Up with therapy Incentive Spirometry Apply ice   Weight Bearing: Weight Bearing as Tolerated (WBAT) RLE Dressings: Maintain Mepilex.   VTE prophylaxis: Aspirin, SCDs, ambulation Dispo: Home today pending therapy evaluation.   Charna Elizabeth Martensen III, PA-C 11/02/2019, 7:49 AM

## 2019-11-02 NOTE — Progress Notes (Signed)
Patient discharged to home w/ wife. Given all belongings, instructions, prescriptions, equipment. Patient and wife to take prescriptions to [facility] to have signed, verbalized understanding to do so. Escorted to pov via w/c.

## 2019-11-02 NOTE — Discharge Summary (Signed)
Discharge Summary  Patient ID: Joshua [name] MRN: [id number] DOB/AGE: Feb 19, 1946 74 y.o.  Admit date: 11/01/2019 Discharge date: 11/02/2019  Admission Diagnoses:  Primary osteoarthritis of right hip  Discharge Diagnoses:  Principal Problem:   Primary osteoarthritis of right hip Active Problems:   Primary osteoarthritis of left knee   History of bleeding ulcers   History of testicular cancer   HTN (hypertension)   HLD (hyperlipidemia)   Primary osteoarthritis of hip   Past Medical History:  Diagnosis Date  . Arthritis   . Cancer [facility])    Testicular   . GERD (gastroesophageal reflux disease)   . Hypertension     Surgeries: Procedure(s): TOTAL HIP ARTHROPLASTY ANTERIOR APPROACH KNEE INJECTION on 11/01/2019   Consultants (if any):   Discharged Condition: Improved  Hospital Course: Joshua [name] is an 74 y.o. male who was admitted 11/01/2019 with a diagnosis of Primary osteoarthritis of right hip and went to the operating room on 11/01/2019 and underwent the above named procedures.     He was given perioperative antibiotics:  Anti-infectives (From admission, onward)   Start     Dose/Rate Route Frequency Ordered Stop   11/01/19 1400  ceFAZolin (ANCEF) IVPB 1 g/50 mL premix     1 g 100 mL/hr over 30 Minutes Intravenous Every 6 hours 11/01/19 1209 11/01/19 2137   11/01/19 0600  ceFAZolin (ANCEF) IVPB 2g/100 mL premix     2 g 200 mL/hr over 30 Minutes Intravenous On call to O.R. 11/01/19 0174 11/01/19 9449    .  He was given sequential compression devices, early ambulation, and Asprin for DVT prophylaxis.  He benefited maximally from the hospital stay and there were no complications.    Recent vital signs:  Vitals:   11/02/19 0221 11/02/19 0642  BP: 120/66 120/66  Pulse: 72 67  Resp: 12 18  Temp: 98 F (36.7 C) 97.7 F (36.5 C)  SpO2: 97% 98%    Recent laboratory studies:  Lab Results  Component Value Date   HGB 14.6 10/26/2019   HGB 14.0 11/08/2009   Lab  Results  Component Value Date   WBC 6.6 10/26/2019   PLT 157 10/26/2019   No results found for: INR Lab Results  Component Value Date   NA 136 10/26/2019   K 4.4 10/26/2019   CL 100 10/26/2019   CO2 26 10/26/2019   BUN 18 10/26/2019   CREATININE 0.94 10/26/2019   GLUCOSE 99 10/26/2019    Discharge Medications:   Allergies as of 11/02/2019   No Known Allergies     Medication List    TAKE these medications   amLODipine 10 MG tablet Commonly known as: NORVASC Take 10 mg by mouth daily.   aspirin EC 81 MG tablet Take 1 tablet (81 mg total) by mouth 2 (two) times daily. For DVT prophylaxis for 30 days after surgery.   atorvastatin 20 MG tablet Commonly known as: LIPITOR Take 20 mg by mouth every Monday, Wednesday, and Friday.   b complex vitamins tablet Take 1 tablet by mouth daily.   baclofen 10 MG tablet Commonly known as: LIORESAL Take 1 tablet (10 mg total) by mouth 3 (three) times daily as needed for muscle spasms.   gabapentin 100 MG capsule Commonly known as: Neurontin Take 1 capsule (100 mg total) by mouth 2 (two) times daily as needed for up to 14 days. For pain.   hydrochlorothiazide 25 MG tablet Commonly known as: HYDRODIURIL Take 25 mg by mouth daily.  HYDROcodone-acetaminophen 5-325 MG tablet Commonly known as: Norco Take 1-2 tablets by mouth every 6 (six) hours as needed for severe pain.   levothyroxine 112 MCG tablet Commonly known as: SYNTHROID Take 112 mcg by mouth daily before breakfast.   losartan 100 MG tablet Commonly known as: COZAAR Take 50 mg by mouth daily.   metoprolol succinate 100 MG 24 hr tablet Commonly known as: TOPROL-XL Take 50 mg by mouth daily. Take with or immediately following a meal.   omeprazole 20 MG capsule Commonly known as: PriLOSEC Take 1 capsule (20 mg total) by mouth daily. 30 days for gastroprotection while taking NSAIDs.   ondansetron 4 MG tablet Commonly known as: Zofran Take 1 tablet (4 mg total) by  mouth every 8 (eight) hours as needed for nausea or vomiting.   VITAMIN B-12 PO Take 1 tablet by mouth daily.   Voltaren 1 % Gel Generic drug: diclofenac Sodium Apply 1 application topically in the morning, at noon, and at bedtime.       Diagnostic Studies: DG C-Arm 1-60 Min-No Report  Result Date: 11/01/2019 Fluoroscopy was utilized by the requesting physician.  No radiographic interpretation.   DG HIP OPERATIVE UNILAT W OR W/O PELVIS RIGHT  Result Date: 11/01/2019 CLINICAL DATA:  Right hip replacement EXAM: OPERATIVE RIGHT HIP WITH PELVIS COMPARISON:  None. FLUOROSCOPY TIME:  Radiation Exposure Index (as provided by the fluoroscopic device): Not available If the device does not provide the exposure index: Fluoroscopy Time:  10 seconds Number of Acquired Images:  2 FINDINGS: Right hip prosthesis is noted in satisfactory position. Degenerative changes in the pubic symphysis are seen. IMPRESSION: Status post right hip replacement Electronically Signed   By: Inez Catalina M.D.   On: 11/01/2019 11:41    Disposition: Discharge disposition: 01-Home or Self Care       Discharge Instructions    Discharge patient   Complete by: As directed    Discharge disposition: 01-Home or Self Care   Discharge patient date: 11/02/2019      Follow-up Information    Renette Butters, MD.   Specialty: Orthopedic Surgery Contact information: [address] [place] [address] [phone]            Signed: Prudencio Burly III PA-C 11/02/2019, 7:51 AM

## 2019-11-02 NOTE — TOC Transition Note (Signed)
Transition of Care [facility]) - CM/SW Discharge Note   Patient Details  Name: Joshua [name] MRN: [id number] Date of Birth: [birth year]  [facility] CM/SW Contact:  Lennart Pall, LCSW Phone Number: 11/02/2019, 11:09 AM   Clinical Narrative:   Met briefly with pt to confirm DME and HH arranged (referrals placed PTA). No TOC needs.    Final next level of care: [place] Barriers to Discharge: No Barriers Identified   Patient Goals and CMS Choice        Discharge Placement                       Discharge Plan and Services                DME Arranged: Walker rolling DME Agency: [employer]       HH Arranged: PT [place] Agency: [facility]        Social Determinants of Health (SDOH) Interventions     Readmission Risk Interventions No flowsheet data found.

## 2019-11-02 NOTE — Progress Notes (Signed)
Physical Therapy Treatment Patient Details Name: Joshua [name] MRN: [id number] DOB: 1946-01-21 Today's Date: 11/02/2019    History of Present Illness Patient is 74 y.o. ale s/p Rt THA anterior approach on 11/01/19 with PMH significant for HTN, GERD, OA, testicular cancer.    PT Comments    Progressing well. Reviewed/practiced exercises, gait training, and stair training. Issued HEP for pt to perform until he begins therapy-unsure if it will be HHPT or OP PT. All education completed. Okay to d/c from PT standpoint.     Follow Up Recommendations  Follow surgeon's recommendation for DC plan and follow-up therapies     Equipment Recommendations  Rolling walker with 5" wheels    Recommendations for Other Services       Precautions / Restrictions Precautions Precautions: Fall Restrictions Weight Bearing Restrictions: No Other Position/Activity Restrictions: WBAT    Mobility  Bed Mobility Overal bed mobility: Needs Assistance Bed Mobility: Supine to Sit     Supine to sit: Supervision;HOB elevated        Transfers Overall transfer level: Needs assistance Equipment used: Rolling walker (2 wheeled) Transfers: Sit to/from Stand Sit to Stand: Supervision            Ambulation/Gait Ambulation/Gait assistance: Supervision Gait Distance (Feet): 150 Feet Assistive device: Rolling walker (2 wheeled) Gait Pattern/deviations: Step-to pattern;Step-through pattern;Decreased stride length         Stairs Stairs: Yes Min guard    Number of Stairs: 2 General stair comments: up and over portable stairs x 1. VCs safety, sequence, technique. Pt's non op leg has some debility and will require surgery soon per pt. He did better with reversing the sequencing when ascending/descending stairs for this reason.   Wheelchair Mobility    Modified Rankin (Stroke Patients Only)       Balance Overall balance assessment: Needs assistance         Standing balance support:  Bilateral upper extremity supported Standing balance-Leahy Scale: Fair                              Cognition Arousal/Alertness: Awake/alert Behavior During Therapy: WFL for tasks assessed/performed Overall Cognitive Status: Within Functional Limits for tasks assessed                                        Exercises      General Comments        Pertinent Vitals/Pain Pain Assessment: 0-10 Pain Score: 4  Pain Location: R hip/thigh Pain Descriptors / Indicators: Sore;Aching Pain Intervention(s): Monitored during session;Repositioned;Ice applied    Home Living                      Prior Function            PT Goals (current goals can now be found in the care plan section) Progress towards PT goals: Progressing toward goals    Frequency    7X/week      PT Plan      Co-evaluation              AM-PAC PT "6 Clicks" Mobility   Outcome Measure  Help needed turning from your back to your side while in a flat bed without using bedrails?: A Little Help needed moving from lying on your back to sitting on the side of a flat  bed without using bedrails?: A Little Help needed moving to and from a bed to a chair (including a wheelchair)?: A Little Help needed standing up from a chair using your arms (e.g., wheelchair or bedside chair)?: A Little Help needed to walk in hospital room?: A Little Help needed climbing 3-5 steps with a railing? : A Little 6 Click Score: 18    End of Session Equipment Utilized During Treatment: Gait belt Activity Tolerance: Patient tolerated treatment well Patient left: in chair;with call bell/phone within reach   PT Visit Diagnosis: Other abnormalities of gait and mobility (R26.89)     Time: 1000-1026 PT Time Calculation (min) (ACUTE ONLY): 26 min  Charges:  $Gait Training: 8-22 mins $Therapeutic Exercise: 8-22 mins                         Doreatha Massed, PT Acute Rehabilitation  Office:  [phone] Pager: [phone]

## 2019-11-02 NOTE — Plan of Care (Signed)
Plan of care reviewed and discussed with the patient. 

## 2019-11-07 ENCOUNTER — Encounter (HOSPITAL_COMMUNITY): Payer: Self-pay | Admitting: Orthopedic Surgery

## 2019-12-06 ENCOUNTER — Encounter: Payer: Self-pay | Admitting: Orthopedic Surgery

## 2019-12-06 DIAGNOSIS — Z96641 Presence of right artificial hip joint: Secondary | ICD-10-CM

## 2019-12-06 HISTORY — DX: Presence of right artificial hip joint: Z96.641

## 2019-12-06 NOTE — H&P (Signed)
TOTAL KNEE ADMISSION H&P  Patient is being admitted for left total knee arthroplasty.  Subjective:  Chief Complaint:left knee pain.  HPI: Joshua [name], 74 y.o. male, has a history of pain and functional disability in the left knee due to arthritis and has failed non-surgical conservative treatments for greater than 12 weeks to includeNSAID's and/or analgesics, corticosteriod injections, viscosupplementation injections, flexibility and strengthening excercises, supervised PT with diminished ADL's post treatment, use of assistive devices and activity modification.  Onset of symptoms was gradual, starting 10 years ago with gradually worsening course since that time. The patient noted no past surgery on the left knee(s).  Patient currently rates pain in the left knee(s) at 10 out of 10 with activity. Patient has night pain, worsening of pain with activity and weight bearing, pain that interferes with activities of daily living, crepitus and joint swelling.  Patient has evidence of subchondral sclerosis, periarticular osteophytes and joint space narrowing by imaging studies.  There is no active infection.  Patient Active Problem List   Diagnosis Date Noted  . 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 Primary osteoarthritis of hip 11/01/2019  . Primary osteoarthritis of right hip 10/12/2019  . Primary osteoarthritis of left knee 10/12/2019  . History of bleeding ulcers 10/12/2019  . History of testicular cancer 10/12/2019  . HTN (hypertension) 10/12/2019  . HLD (hyperlipidemia) 10/12/2019   Past Medical History:  Diagnosis Date  . Arthritis   . Cancer [facility])    Testicular   . GERD (gastroesophageal reflux disease)   . 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 Hypertension     Past Surgical History:  Procedure Laterality Date  . COLONOSCOPY    . INJECTION KNEE Left 11/01/2019   Procedure: KNEE INJECTION;  Surgeon: Renette Butters, MD;  Location: [facility]S;   Service: Orthopedics;  Laterality: Left;  . SURGERY SCROTAL / TESTICULAR    . TONSILLECTOMY    . TOTAL HIP ARTHROPLASTY Right 11/01/2019   Procedure: TOTAL HIP ARTHROPLASTY ANTERIOR APPROACH;  Surgeon: Renette Butters, MD;  Location: [facility];  Service: Orthopedics;  Laterality: Right;    No current facility-administered medications for this encounter.   Current Outpatient Medications  Medication Sig Dispense Refill Last Dose  . amLODipine (NORVASC) 10 MG tablet Take 10 mg by mouth daily.     . hydrochlorothiazide (HYDRODIURIL) 25 MG tablet Take 25 mg by mouth daily.     Marland Kitchen levothyroxine (SYNTHROID) 112 MCG tablet Take 112 mcg by mouth daily before breakfast.     . losartan (COZAAR) 100 MG tablet Take 50 mg by mouth daily.     . metoprolol succinate (TOPROL-XL) 100 MG 24 hr tablet Take 50 mg by mouth daily. Take with or immediately following a meal.     . aspirin EC 81 MG tablet Take 1 tablet (81 mg total) by mouth 2 (two) times daily. For DVT prophylaxis for 30 days after surgery. (Patient not taking: Reported on 12/05/2019) 60 tablet 0 Not Taking at Unknown time  . atorvastatin (LIPITOR) 20 MG tablet Take 20 mg by mouth every Monday, Wednesday, and Friday.     Marland Kitchen b complex vitamins tablet Take 1 tablet by mouth daily.     . baclofen (LIORESAL) 10 MG tablet Take 1 tablet (10 mg total) by mouth 3 (three) times daily as needed for muscle spasms. (Patient not taking: Reported on 12/05/2019) 20 each 0 Not Taking at Unknown time  . Cyanocobalamin (VITAMIN B-12 PO) Take 1  tablet by mouth daily.     . diclofenac Sodium (VOLTAREN) 1 % GEL Apply 1 application topically in the morning, at noon, and at bedtime.     . gabapentin (NEURONTIN) 100 MG capsule Take 1 capsule (100 mg total) by mouth 2 (two) times daily as needed for up to 14 days. For pain. (Patient not taking: Reported on 12/05/2019) 28 capsule 0 Not Taking at Unknown time  . HYDROcodone-acetaminophen (NORCO) 5-325 MG tablet Take 1-2 tablets by mouth  every 6 (six) hours as needed for severe pain. (Patient not taking: Reported on 12/05/2019) 30 tablet 0 Not Taking at Unknown time  . omeprazole (PRILOSEC) 20 MG capsule Take 1 capsule (20 mg total) by mouth daily. 30 days for gastroprotection while taking NSAIDs. (Patient not taking: Reported on 12/05/2019) 30 capsule 0 Not Taking at Unknown time  . ondansetron (ZOFRAN) 4 MG tablet Take 1 tablet (4 mg total) by mouth every 8 (eight) hours as needed for nausea or vomiting. (Patient not taking: Reported on 12/05/2019) 20 tablet 0 Not Taking at Unknown time   No Known Allergies  Social History   Tobacco Use  . Smoking status: Never Smoker  . Smokeless tobacco: Never Used  Substance Use Topics  . Alcohol use: Yes    Alcohol/week: 1.0 standard drink    Types: 1 Glasses of wine per week    Comment: week    No family history on file.   Review of Systems  Constitutional: Negative.   HENT: Negative.   Eyes: Negative.   Respiratory: Negative.   Cardiovascular: Negative.   Gastrointestinal: Negative.   Endocrine: Negative.   Genitourinary: Negative.   Musculoskeletal: Positive for arthralgias, gait problem, joint swelling and myalgias.  Skin: Positive for rash.       Frost bite knee  Allergic/Immunologic: Negative.   Hematological: Negative.   Psychiatric/Behavioral: Negative.     Objective:  Physical Exam Constitutional:      Appearance: Normal appearance.  HENT:     Head: Normocephalic and atraumatic.     Nose: Nose normal.     Mouth/Throat:     Mouth: Mucous membranes are moist.     Pharynx: Oropharynx is clear.  Eyes:     Pupils: Pupils are equal, round, and reactive to light.  Cardiovascular:     Rate and Rhythm: Normal rate and regular rhythm.     Heart sounds: Normal heart sounds.  Pulmonary:     Effort: Pulmonary effort is normal.  Abdominal:     General: Bowel sounds are normal.     Palpations: Abdomen is soft.  Musculoskeletal:        General: Swelling,  tenderness and deformity present.     Cervical back: Neck supple.     Right lower leg: Edema present.     Left lower leg: Edema present.       Legs:     Comments: Independently ambulatory with the assistance of a cane   Rash below knee   Frost bite.  Improving.  -5 to 70 degees 2+crep 2+ synovitis medial joint line tenderness    Varus deformity.   Right knee has full range of motion 1+ crep, 1+ synovitis, DNVI  Skin:    General: Skin is warm and dry.     Capillary Refill: Capillary refill takes less than 2 seconds.  Neurological:     General: No focal deficit present.     Mental Status: He is alert.  Psychiatric:  Mood and Affect: Mood normal.        Behavior: Behavior normal.     Vital signs in last 24 hours: Temp:  [97.9 F (36.6 C)] 97.9 F (36.6 C) (07/13 1300) Pulse Rate:  [78] 78 (07/13 1300) BP: (143)/(82) 143/82 (07/13 1300) SpO2:  [96 %] 96 % (07/13 1300) Weight:  [113.4 kg] 113.4 kg (07/13 1300)  Labs:   Estimated body mass index is 37.46 kg/m as calculated from the following:   Height as of this encounter: 5' 8.5" (1.74 m).   Weight as of this encounter: 113.4 kg.   Imaging Review Plain radiographs demonstrate severe degenerative joint disease of the left knee(s). The overall alignment issignificant varus. The bone quality appears to be good for age and reported activity level.      Assessment/Plan:  End stage arthritis, left knee   The patient history, physical examination, clinical judgment of the provider and imaging studies are consistent with end stage degenerative joint disease of the left knee(s) and total knee arthroplasty is deemed medically necessary. The treatment options including medical management, injection therapy arthroscopy and arthroplasty were discussed at length. The risks and benefits of total knee arthroplasty were presented and reviewed. The risks due to aseptic loosening, infection, stiffness, patella tracking problems,  thromboembolic complications and other imponderables were discussed. The patient acknowledged the explanation, agreed to proceed with the plan and consent was signed. Patient is being admitted for inpatient treatment for surgery, pain control, PT, OT, prophylactic antibiotics, VTE prophylaxis, progressive ambulation and ADL's and discharge planning. The patient is planning to be discharged home with home health PT and then outpatient PT     Patient's anticipated LOS is less than 2 midnights, meeting these requirements: - Younger than 68 - Lives within 1 hour of care - Has a competent adult at home to recover with post-op recover - NO history of  - Chronic pain requiring opiods  - Diabetes  - Coronary Artery Disease  - Heart failure  - Heart attack  - Stroke  - DVT/VTE  - Cardiac arrhythmia  - Respiratory Failure/COPD  - Renal failure  - Anemia  - Advanced Liver disease

## 2019-12-15 ENCOUNTER — Other Ambulatory Visit (HOSPITAL_COMMUNITY): Payer: Medicare Other

## 2019-12-15 NOTE — Patient Instructions (Addendum)
DUE TO COVID-19 ONLY ONE VISITOR IS ALLOWED TO COME WITH YOU AND STAY IN THE WAITING ROOM ONLY DURING PRE OP AND PROCEDURE DAY OF SURGERY. THE 1 VISITOR MAY VISIT WITH YOU AFTER SURGERY IN YOUR PRIVATE ROOM DURING VISITING HOURS ONLY!  YOU NEED TO HAVE A COVID 19 TEST ON 12-23-19 @ 10:55 AM, THIS TEST MUST BE DONE BEFORE SURGERY, COME  [place], [place] , [id number].  ([place]) ONCE YOUR COVID TEST IS COMPLETED, PLEASE BEGIN THE QUARANTINE INSTRUCTIONS AS OUTLINED IN YOUR HANDOUT.                Joshua [name]  12/15/2019   Your procedure is scheduled on: 12-27-19   Report to [facility] Main  Entrance    Report to Short Stay at 5:30 AM     Call this number if you have problems the morning of surgery [phone]    Remember: AFTER MIDNIGHT,  NOTHING BY MOUTH EXCEPT CLEAR LIQUIDS UNTIL 4:30 AM. PLEASE FINISH ENSURE DRINK  WHICH NEEDS TO BE COMPLETED AT 4:30 AM.   CLEAR LIQUID DIET   Foods Allowed                                                                     Foods Excluded  Coffee and tea, regular and decaf                             liquids that you cannot  Plain Jell-O any favor except red or purple                                           see through such as: Fruit ices (not with fruit pulp)                                     milk, soups, orange juice  Iced Popsicles                                    All solid food Carbonated beverages, regular and diet                                    Cranberry, grape and apple juices Sports drinks like Gatorade Lightly seasoned clear broth or consume(fat free) Sugar, honey syrup   _____________________________________________________________________     Take these medicines the morning of surgery with A SIP OF WATER: Amlodipine (Norvasc), Levothryoxine (Synthroid), and Metoprolol Succinate (Toprol-XL)   BRUSH YOUR TEETH MORNING OF SURGERY AND RINSE YOUR MOUTH OUT, NO CHEWING GUM CANDY OR  MINTS.                                You may not have any metal on your body including hair pins and  piercings     Do not wear jewelry, make-up, lotions, powders or perfumes, deodorant                         Men may shave face and neck.   Do not bring valuables to the hospital. [place].  Contacts, dentures or bridgework may not be worn into surgery.  You may bring a small overnight bag      Special Instructions: N/A              Please read over the following fact sheets you were given: _____________________________________________________________________             [address] - Preparing for Surgery Before surgery, you can play an important role.  Because skin is not sterile, your skin needs to be as free of germs as possible.  You can reduce the number of germs on your skin by washing with CHG (chlorahexidine gluconate) soap before surgery.  CHG is an antiseptic cleaner which kills germs and bonds with the skin to continue killing germs even after washing. Please DO NOT use if you have an allergy to CHG or antibacterial soaps.  If your skin becomes reddened/irritated stop using the CHG and inform your nurse when you arrive at Short Stay. Do not shave (including legs and underarms) for at least 48 hours prior to the first CHG shower.  You may shave your face/neck. Please follow these instructions carefully:  1.  Shower with CHG Soap the night before surgery and the  morning of Surgery.  2.  If you choose to wash your hair, wash your hair first as usual with your  normal  shampoo.  3.  After you shampoo, rinse your hair and body thoroughly to remove the  shampoo.                           4.  Use CHG as you would any other liquid soap.  You can apply chg directly  to the skin and wash                       Gently with a scrungie or clean washcloth.  5.  Apply the CHG Soap to your body ONLY FROM THE NECK DOWN.   Do  not use on face/ open                           Wound or open sores. Avoid contact with eyes, ears mouth and genitals (private parts).                       Wash face,  Genitals (private parts) with your normal soap.             6.  Wash thoroughly, paying special attention to the area where your surgery  will be performed.  7.  Thoroughly rinse your body with warm water from the neck down.  8.  DO NOT shower/wash with your normal soap after using and rinsing off  the CHG Soap.                9.  Pat yourself dry with a clean towel.  10.  Wear clean pajamas.            11.  Place clean sheets on your bed the night of your first shower and do not  sleep with pets. Day of Surgery : Do not apply any lotions/deodorants the morning of surgery.  Please wear clean clothes to the hospital/surgery center.  FAILURE TO FOLLOW THESE INSTRUCTIONS MAY RESULT IN THE CANCELLATION OF YOUR SURGERY PATIENT SIGNATURE_________________________________  NURSE SIGNATURE__________________________________  ________________________________________________________________________   Joshua [name]  An incentive spirometer is a tool that can help keep your lungs clear and active. This tool measures how well you are filling your lungs with each breath. Taking long deep breaths may help reverse or decrease the chance of developing breathing (pulmonary) problems (especially infection) following:  A long period of time when you are unable to move or be active. BEFORE THE PROCEDURE   If the spirometer includes an indicator to show your best effort, your nurse or respiratory therapist will set it to a desired goal.  If possible, sit up straight or lean slightly forward. Try not to slouch.  Hold the incentive spirometer in an upright position. INSTRUCTIONS FOR USE  1. Sit on the edge of your bed if possible, or sit up as far as you can in bed or on a chair. 2. Hold the incentive spirometer in an upright  position. 3. Breathe out normally. 4. Place the mouthpiece in your mouth and seal your lips tightly around it. 5. Breathe in slowly and as deeply as possible, raising the piston or the ball toward the top of the column. 6. Hold your breath for 3-5 seconds or for as long as possible. Allow the piston or ball to fall to the bottom of the column. 7. Remove the mouthpiece from your mouth and breathe out normally. 8. Rest for a few seconds and repeat Steps 1 through 7 at least 10 times every 1-2 hours when you are awake. Take your time and take a few normal breaths between deep breaths. 9. The spirometer may include an indicator to show your best effort. Use the indicator as a goal to work toward during each repetition. 10. After each set of 10 deep breaths, practice coughing to be sure your lungs are clear. If you have an incision (the cut made at the time of surgery), support your incision when coughing by placing a pillow or rolled up towels firmly against it. Once you are able to get out of bed, walk around indoors and cough well. You may stop using the incentive spirometer when instructed by your caregiver.  RISKS AND COMPLICATIONS  Take your time so you do not get dizzy or light-headed.  If you are in pain, you may need to take or ask for pain medication before doing incentive spirometry. It is harder to take a deep breath if you are having pain. AFTER USE  Rest and breathe slowly and easily.  It can be helpful to keep track of a log of your progress. Your caregiver can provide you with a simple table to help with this. If you are using the spirometer at home, follow these instructions: [place] IF:   You are having difficultly using the spirometer.  You have trouble using the spirometer as often as instructed.  Your pain medication is not giving enough relief while using the spirometer.  You develop fever of 100.5 F (38.1 C) or higher. SEEK IMMEDIATE MEDICAL CARE IF:    You cough up bloody  sputum that had not been present before.  You develop fever of 102 F (38.9 C) or greater.  You develop worsening pain at or near the incision site. MAKE SURE YOU:   Understand these instructions.  Will watch your condition.  Will get help right away if you are not doing well or get worse. Document Released: 09/22/2006 Document Revised: 08/04/2011 Document Reviewed: 11/23/2006 [facility] Patient Information 2014 [place], [place].   ________________________________________________________________________

## 2019-12-15 NOTE — Progress Notes (Addendum)
COVID Vaccine Completed: Date COVID Vaccine completed: COVID vaccine manufacturer: [employer]   PCP - Girtha Hake, MD Cardiologist - N/A No back stimulator  Chest x-ray -  EKG - 10-26-19 Stress Test -  ECHO -  Cardiac Cath -   Sleep Study -  CPAP -   Fasting Blood Sugar -  Checks Blood Sugar _____ times a day  Blood Thinner Instructions: Aspirin Instructions: Last Dose:  ADL's w/o SOB  Anesthesia review:   Patient denies shortness of breath, fever, cough and chest pain at PAT appointment   Patient verbalized understanding of instructions that were given to them at the PAT appointment. Patient was also instructed that they will need to review over the PAT instructions again at home before surgery.

## 2019-12-19 ENCOUNTER — Encounter (HOSPITAL_COMMUNITY): Payer: Self-pay

## 2019-12-19 ENCOUNTER — Encounter (HOSPITAL_COMMUNITY)
Admission: RE | Admit: 2019-12-19 | Discharge: 2019-12-19 | Disposition: A | Discharge status: Home / Self Care | Payer: Medicare Other | Source: Ambulatory Visit | Attending: Orthopedic Surgery | Admitting: Orthopedic Surgery

## 2019-12-19 ENCOUNTER — Other Ambulatory Visit: Payer: Self-pay

## 2019-12-19 DIAGNOSIS — Z01812 Encounter for preprocedural laboratory examination: Secondary | ICD-10-CM | POA: Insufficient documentation

## 2019-12-19 LAB — CBC WITH DIFFERENTIAL/PLATELET
Abs Immature Granulocytes: 0.02 10*3/uL (ref 0.00–0.07)
Basophils Absolute: 0 10*3/uL (ref 0.0–0.1)
Basophils Relative: 1 %
Eosinophils Absolute: 0.1 10*3/uL (ref 0.0–0.5)
Eosinophils Relative: 2 %
HCT: 41.2 % (ref 39.0–52.0)
Hemoglobin: 14.1 g/dL (ref 13.0–17.0)
Immature Granulocytes: 0 %
Lymphocytes Relative: 29 %
Lymphs Abs: 1.7 10*3/uL (ref 0.7–4.0)
MCH: 31.1 pg (ref 26.0–34.0)
MCHC: 34.2 g/dL (ref 30.0–36.0)
MCV: 90.9 fL (ref 80.0–100.0)
Monocytes Absolute: 0.6 10*3/uL (ref 0.1–1.0)
Monocytes Relative: 10 %
Neutro Abs: 3.6 10*3/uL (ref 1.7–7.7)
Neutrophils Relative %: 58 %
Platelets: 173 10*3/uL (ref 150–400)
RBC: 4.53 MIL/uL (ref 4.22–5.81)
RDW: 13.5 % (ref 11.5–15.5)
WBC: 6.1 10*3/uL (ref 4.0–10.5)
nRBC: 0 % (ref 0.0–0.2)

## 2019-12-19 LAB — COMPREHENSIVE METABOLIC PANEL
ALT: 29 U/L (ref 0–44)
AST: 25 U/L (ref 15–41)
Albumin: 4.2 g/dL (ref 3.5–5.0)
Alkaline Phosphatase: 65 U/L (ref 38–126)
Anion gap: 10 (ref 5–15)
BUN: 15 mg/dL (ref 8–23)
CO2: 28 mmol/L (ref 22–32)
Calcium: 9.6 mg/dL (ref 8.9–10.3)
Chloride: 103 mmol/L (ref 98–111)
Creatinine, Ser: 0.8 mg/dL (ref 0.61–1.24)
GFR calc Af Amer: >60 mL/min (ref >60)
GFR calc non Af Amer: >60 mL/min (ref >60)
Glucose, Bld: 102 mg/dL — ABNORMAL HIGH (ref 70–99)
Potassium: 4.3 mmol/L (ref 3.5–5.1)
Sodium: 141 mmol/L (ref 135–145)
Total Bilirubin: 0.7 mg/dL (ref 0.3–1.2)
Total Protein: 7.2 g/dL (ref 6.5–8.1)

## 2019-12-19 LAB — SURGICAL PCR SCREEN
MRSA, PCR: NEGATIVE
Staphylococcus aureus: NEGATIVE

## 2019-12-19 LAB — PROTIME-INR
INR: 1.1 (ref 0.8–1.2)
Prothrombin Time: 13.6 seconds (ref 11.4–15.2)

## 2019-12-19 LAB — APTT: aPTT: 31 seconds (ref 24–36)

## 2019-12-20 LAB — URINE CULTURE: Culture: <10000 — AB

## 2019-12-23 ENCOUNTER — Other Ambulatory Visit (HOSPITAL_COMMUNITY)
Admission: RE | Admit: 2019-12-23 | Discharge: 2019-12-23 | Disposition: A | Discharge status: Home / Self Care | Payer: Medicare Other | Source: Ambulatory Visit | Attending: Orthopedic Surgery | Admitting: Orthopedic Surgery

## 2019-12-23 DIAGNOSIS — Z01812 Encounter for preprocedural laboratory examination: Secondary | ICD-10-CM | POA: Insufficient documentation

## 2019-12-23 DIAGNOSIS — Z20822 Contact with and (suspected) exposure to covid-19: Secondary | ICD-10-CM | POA: Diagnosis not present

## 2019-12-23 LAB — SARS CORONAVIRUS 2 (TAT 6-24 HRS): SARS Coronavirus 2: NEGATIVE

## 2019-12-26 MED ORDER — BUPIVACAINE LIPOSOME 1.3 % IJ SUSP
20.0000 mL | Freq: Once | INTRAMUSCULAR | Status: DC
  Filled 2019-12-26: qty 20

## 2019-12-26 NOTE — Anesthesia Preprocedure Evaluation (Addendum)
Anesthesia Evaluation  Patient identified by MRN, date of birth, ID band Patient awake    Reviewed: Allergy & Precautions, NPO status , Patient's Chart, lab work & pertinent test results, reviewed documented beta blocker date and time   Airway Mallampati: I       Dental no notable dental hx. (+) Dental Advisory Given, Teeth Intact   Pulmonary neg pulmonary ROS,    Pulmonary exam normal breath sounds clear to auscultation       Cardiovascular hypertension, Pt. on medications and Pt. on home beta blockers Normal cardiovascular exam Rhythm:Regular Rate:Normal     Neuro/Psych negative neurological ROS  negative psych ROS   GI/Hepatic Neg liver ROS, GERD  ,  Endo/Other    Renal/GU negative Renal ROS  negative genitourinary   Musculoskeletal  (+) Arthritis ,   Abdominal (+) + obese,   Peds  Hematology negative hematology ROS (+)   Anesthesia Other Findings   Reproductive/Obstetrics negative OB ROS                            Anesthesia Physical  Anesthesia Plan  ASA: II  Anesthesia Plan: Spinal   Post-op Pain Management:  Regional for Post-op pain   Induction:   PONV Risk Score and Plan: 2 and Propofol infusion, TIVA, Treatment may vary due to age or medical condition, Ondansetron, Dexamethasone and Midazolam  Airway Management Planned: Natural Airway and Simple Face Mask  Additional Equipment: None  Intra-op Plan:   Post-operative Plan:   Informed Consent: I have reviewed the patients History and Physical, chart, labs and discussed the procedure including the risks, benefits and alternatives for the proposed anesthesia with the patient or authorized representative who has indicated his/her understanding and acceptance.       Plan Discussed with: CRNA  Anesthesia Plan Comments:        Anesthesia Quick Evaluation

## 2019-12-27 ENCOUNTER — Encounter (HOSPITAL_COMMUNITY)
Admission: RE | Disposition: A | Discharge status: Home Health | Payer: Self-pay | Source: Home / Self Care | Attending: Orthopedic Surgery

## 2019-12-27 ENCOUNTER — Encounter (HOSPITAL_COMMUNITY): Payer: Self-pay | Admitting: Orthopedic Surgery

## 2019-12-27 ENCOUNTER — Ambulatory Visit (HOSPITAL_COMMUNITY): Payer: Medicare Other | Admitting: Anesthesiology

## 2019-12-27 ENCOUNTER — Inpatient Hospital Stay (HOSPITAL_COMMUNITY)
Admission: RE | Admit: 2019-12-27 | Discharge: 2019-12-29 | DRG: 470 | Disposition: A | Discharge status: Home Health | Payer: Medicare Other | Attending: Orthopedic Surgery | Admitting: Orthopedic Surgery

## 2019-12-27 ENCOUNTER — Other Ambulatory Visit: Payer: Self-pay

## 2019-12-27 DIAGNOSIS — E785 Hyperlipidemia, unspecified: Secondary | ICD-10-CM | POA: Diagnosis present

## 2019-12-27 DIAGNOSIS — M1712 Unilateral primary osteoarthritis, left knee: Secondary | ICD-10-CM | POA: Diagnosis present

## 2019-12-27 DIAGNOSIS — Z8547 Personal history of malignant neoplasm of testis: Secondary | ICD-10-CM

## 2019-12-27 DIAGNOSIS — Z96641 Presence of right artificial hip joint: Secondary | ICD-10-CM

## 2019-12-27 DIAGNOSIS — Z9889 Other specified postprocedural states: Secondary | ICD-10-CM

## 2019-12-27 DIAGNOSIS — I10 Essential (primary) hypertension: Secondary | ICD-10-CM | POA: Diagnosis present

## 2019-12-27 DIAGNOSIS — Z8711 Personal history of peptic ulcer disease: Secondary | ICD-10-CM

## 2019-12-27 HISTORY — PX: TOTAL KNEE ARTHROPLASTY: SHX125

## 2019-12-27 SURGERY — ARTHROPLASTY, KNEE, TOTAL
Anesthesia: Spinal | Site: Knee | Laterality: Left

## 2019-12-27 MED ORDER — PROPOFOL 500 MG/50ML IV EMUL
INTRAVENOUS | Status: DC | PRN
  Administered 2019-12-27: 50 ug/kg/min via INTRAVENOUS

## 2019-12-27 MED ORDER — ROPIVACAINE HCL 7.5 MG/ML IJ SOLN
INTRAMUSCULAR | Status: DC | PRN
Start: 2019-12-27 — End: 2019-12-27
  Administered 2019-12-27 (×4): 5 mL via PERINEURAL

## 2019-12-27 MED ORDER — ALBUMIN HUMAN 5 % IV SOLN
INTRAVENOUS | Status: AC
  Filled 2019-12-27: qty 250

## 2019-12-27 MED ORDER — ASPIRIN EC 81 MG PO TBEC
81.0000 mg | DELAYED_RELEASE_TABLET | Freq: Two times a day (BID) | ORAL | 0 refills | Status: AC
Start: 2019-12-27 — End: 2020-01-26

## 2019-12-27 MED ORDER — LIDOCAINE HCL (CARDIAC) PF 100 MG/5ML IV SOSY
PREFILLED_SYRINGE | INTRAVENOUS | Status: DC | PRN
  Administered 2019-12-27: 30 mg via INTRAVENOUS

## 2019-12-27 MED ORDER — TRANEXAMIC ACID-NACL 1000-0.7 MG/100ML-% IV SOLN
1000.0000 mg | INTRAVENOUS | Status: AC
  Administered 2019-12-27: 1000 mg via INTRAVENOUS

## 2019-12-27 MED ORDER — FENTANYL CITRATE (PF) 100 MCG/2ML IJ SOLN
INTRAMUSCULAR | Status: AC
  Filled 2019-12-27: qty 2

## 2019-12-27 MED ORDER — ONDANSETRON HCL 4 MG/2ML IJ SOLN: 4.0000 mg | Freq: Three times a day (TID) | INTRAMUSCULAR | Status: DC

## 2019-12-27 MED ORDER — POVIDONE-IODINE 10 % EX SWAB: 2.0000 "application " | Freq: Once | CUTANEOUS | Status: DC

## 2019-12-27 MED ORDER — STERILE WATER FOR IRRIGATION IR SOLN
Status: DC | PRN
  Administered 2019-12-27: 2000 mL

## 2019-12-27 MED ORDER — MORPHINE SULFATE (PF) 4 MG/ML IV SOLN: 0.5000 mg | INTRAVENOUS | Status: DC | PRN

## 2019-12-27 MED ORDER — HYDROMORPHONE HCL 1 MG/ML IJ SOLN: 0.2500 mg | INTRAMUSCULAR | Status: DC | PRN

## 2019-12-27 MED ORDER — FENTANYL CITRATE (PF) 100 MCG/2ML IJ SOLN
INTRAMUSCULAR | Status: DC | PRN
  Administered 2019-12-27 (×2): 50 ug via INTRAVENOUS

## 2019-12-27 MED ORDER — EPHEDRINE 5 MG/ML INJ
INTRAVENOUS | Status: AC
  Filled 2019-12-27: qty 10

## 2019-12-27 MED ORDER — DEXAMETHASONE SODIUM PHOSPHATE 10 MG/ML IJ SOLN
8.0000 mg | Freq: Once | INTRAMUSCULAR | Status: AC
  Administered 2019-12-27: 8 mg via INTRAVENOUS

## 2019-12-27 MED ORDER — CEFAZOLIN SODIUM-DEXTROSE 2-4 GM/100ML-% IV SOLN
2.0000 g | INTRAVENOUS | Status: AC
  Administered 2019-12-27: 2 g via INTRAVENOUS

## 2019-12-27 MED ORDER — ACETAMINOPHEN 10 MG/ML IV SOLN: 1000.0000 mg | Freq: Once | INTRAVENOUS | Status: DC | PRN

## 2019-12-27 MED ORDER — SODIUM CHLORIDE (PF) 0.9 % IJ SOLN
INTRAMUSCULAR | Status: AC
  Filled 2019-12-27: qty 50

## 2019-12-27 MED ORDER — BACLOFEN 10 MG PO TABS
10.0000 mg | ORAL_TABLET | Freq: Two times a day (BID) | ORAL | 0 refills | Status: AC
Start: 2019-12-27 — End: 2020-01-10

## 2019-12-27 MED ORDER — DEXAMETHASONE SODIUM PHOSPHATE 10 MG/ML IJ SOLN
INTRAMUSCULAR | Status: AC
  Filled 2019-12-27: qty 1

## 2019-12-27 MED ORDER — TRANEXAMIC ACID-NACL 1000-0.7 MG/100ML-% IV SOLN
INTRAVENOUS | Status: AC
  Filled 2019-12-27: qty 100

## 2019-12-27 MED ORDER — ACETAMINOPHEN 325 MG PO TABS: 325.0000 mg | ORAL_TABLET | Freq: Four times a day (QID) | ORAL | Status: DC | PRN

## 2019-12-27 MED ORDER — SODIUM CHLORIDE 0.9 % IR SOLN
Status: DC | PRN
  Administered 2019-12-27: 1000 mL

## 2019-12-27 MED ORDER — POVIDONE-IODINE 7.5 % EX SOLN: Freq: Once | CUTANEOUS | Status: AC

## 2019-12-27 MED ORDER — CEFAZOLIN SODIUM-DEXTROSE 2-4 GM/100ML-% IV SOLN
INTRAVENOUS | Status: AC
  Filled 2019-12-27: qty 100

## 2019-12-27 MED ORDER — MIDAZOLAM HCL 5 MG/5ML IJ SOLN
INTRAMUSCULAR | Status: DC | PRN
  Administered 2019-12-27: 2 mg via INTRAVENOUS

## 2019-12-27 MED ORDER — PROPOFOL 500 MG/50ML IV EMUL
INTRAVENOUS | Status: AC
  Filled 2019-12-27: qty 50

## 2019-12-27 MED ORDER — ORAL CARE MOUTH RINSE: 15.0000 mL | Freq: Once | OROMUCOSAL | Status: AC

## 2019-12-27 MED ORDER — CHLORHEXIDINE GLUCONATE 0.12 % MT SOLN
15.0000 mL | Freq: Once | OROMUCOSAL | Status: AC
  Administered 2019-12-27: 15 mL via OROMUCOSAL

## 2019-12-27 MED ORDER — ONDANSETRON HCL 4 MG/2ML IJ SOLN
INTRAMUSCULAR | Status: DC | PRN
  Administered 2019-12-27: 4 mg via INTRAVENOUS

## 2019-12-27 MED ORDER — KETOROLAC TROMETHAMINE 15 MG/ML IJ SOLN
INTRAMUSCULAR | Status: AC
  Administered 2019-12-27: 15 mg via INTRAVENOUS
  Filled 2019-12-27: qty 1

## 2019-12-27 MED ORDER — CELECOXIB 200 MG PO CAPS
200.0000 mg | ORAL_CAPSULE | Freq: Two times a day (BID) | ORAL | 2 refills | Status: DC
Start: 2019-12-27 — End: 2020-07-10

## 2019-12-27 MED ORDER — LACTATED RINGERS IV SOLN: INTRAVENOUS | Status: DC

## 2019-12-27 MED ORDER — EPHEDRINE SULFATE 50 MG/ML IJ SOLN
INTRAMUSCULAR | Status: DC | PRN
Start: 2019-12-27 — End: 2019-12-27
  Administered 2019-12-27: 10 mg via INTRAVENOUS
  Administered 2019-12-27 (×2): 5 mg via INTRAVENOUS

## 2019-12-27 MED ORDER — HYDROCODONE-ACETAMINOPHEN 5-325 MG PO TABS
1.0000 | ORAL_TABLET | ORAL | Status: DC | PRN
  Administered 2019-12-27 – 2019-12-28 (×4): 2 via ORAL
  Administered 2019-12-28: 1 via ORAL
  Administered 2019-12-28 – 2019-12-29 (×4): 2 via ORAL
  Filled 2019-12-27 (×4): qty 2
  Filled 2019-12-27 (×2): qty 1
  Filled 2019-12-27: qty 2
  Filled 2019-12-27: qty 1
  Filled 2019-12-27 (×2): qty 2

## 2019-12-27 MED ORDER — MIDAZOLAM HCL 2 MG/2ML IJ SOLN
INTRAMUSCULAR | Status: AC
  Filled 2019-12-27: qty 2

## 2019-12-27 MED ORDER — PROMETHAZINE HCL 25 MG/ML IJ SOLN: 6.2500 mg | INTRAMUSCULAR | Status: DC | PRN

## 2019-12-27 MED ORDER — ROCURONIUM BROMIDE 10 MG/ML (PF) SYRINGE
PREFILLED_SYRINGE | INTRAVENOUS | Status: AC
  Filled 2019-12-27: qty 10

## 2019-12-27 MED ORDER — BUPIVACAINE IN DEXTROSE 0.75-8.25 % IT SOLN
INTRATHECAL | Status: DC | PRN
  Administered 2019-12-27: 2 mL via INTRATHECAL

## 2019-12-27 MED ORDER — DOCUSATE SODIUM 100 MG PO CAPS
100.0000 mg | ORAL_CAPSULE | Freq: Two times a day (BID) | ORAL | Status: DC
  Administered 2019-12-27 – 2019-12-28 (×3): 100 mg via ORAL
  Filled 2019-12-27 (×3): qty 1

## 2019-12-27 MED ORDER — ONDANSETRON HCL 4 MG/2ML IJ SOLN
INTRAMUSCULAR | Status: AC
  Filled 2019-12-27: qty 2

## 2019-12-27 MED ORDER — BUPIVACAINE LIPOSOME 1.3 % IJ SUSP
INTRAMUSCULAR | Status: DC | PRN
  Administered 2019-12-27: 20 mL

## 2019-12-27 MED ORDER — ONDANSETRON HCL 4 MG PO TABS: 4.0000 mg | ORAL_TABLET | Freq: Three times a day (TID) | ORAL | 1 refills | Status: AC | PRN

## 2019-12-27 MED ORDER — KETOROLAC TROMETHAMINE 15 MG/ML IJ SOLN: 15.0000 mg | Freq: Once | INTRAMUSCULAR | Status: AC

## 2019-12-27 MED ORDER — HYDROCODONE-ACETAMINOPHEN 5-325 MG PO TABS: 1.0000 | ORAL_TABLET | ORAL | 0 refills | Status: AC | PRN

## 2019-12-27 MED ORDER — LACTATED RINGERS IV SOLN: INTRAVENOUS | Status: AC

## 2019-12-27 MED ORDER — ROPIVACAINE HCL 5 MG/ML IJ SOLN
INTRAMUSCULAR | Status: DC | PRN
Start: 2019-12-27 — End: 2019-12-27
  Administered 2019-12-27 (×2): 5 mL via PERINEURAL

## 2019-12-27 MED ORDER — MEPERIDINE HCL 50 MG/ML IJ SOLN: 6.2500 mg | INTRAMUSCULAR | Status: DC | PRN

## 2019-12-27 MED ORDER — CLONIDINE HCL (ANALGESIA) 100 MCG/ML EP SOLN
EPIDURAL | Status: DC | PRN
  Administered 2019-12-27: 100 ug

## 2019-12-27 MED ORDER — SODIUM CHLORIDE 0.9% FLUSH
INTRAVENOUS | Status: DC | PRN
  Administered 2019-12-27: 30 mL

## 2019-12-27 MED ORDER — PROPOFOL 10 MG/ML IV BOLUS
INTRAVENOUS | Status: AC
  Filled 2019-12-27: qty 20

## 2019-12-27 MED ORDER — LIDOCAINE 2% (20 MG/ML) 5 ML SYRINGE
INTRAMUSCULAR | Status: AC
  Filled 2019-12-27: qty 5

## 2019-12-27 MED ORDER — 0.9 % SODIUM CHLORIDE (POUR BTL) OPTIME
TOPICAL | Status: DC | PRN
  Administered 2019-12-27: 1000 mL

## 2019-12-27 MED ORDER — POLYETHYLENE GLYCOL 3350 17 G PO PACK: 17.0000 g | PACK | Freq: Every day | ORAL | Status: DC | PRN

## 2019-12-27 MED ORDER — BISACODYL 10 MG RE SUPP: 10.0000 mg | Freq: Every day | RECTAL | Status: DC | PRN

## 2019-12-27 SURGICAL SUPPLY — 54 items
BLADE HEX COATED 2.75 (ELECTRODE) ×2 IMPLANT
BLADE SAG 18X100X1.27 (BLADE) ×2 IMPLANT
BLADE SAGITTAL 25.0X1.37X90 (BLADE) ×2 IMPLANT
BLADE SURG 15 STRL LF DISP TIS (BLADE) ×1 IMPLANT
BLADE SURG 15 STRL SS (BLADE) ×2
BLADE SURG SZ10 CARB STEEL (BLADE) ×4 IMPLANT
BNDG CMPR MED 10X6 ELC LF (GAUZE/BANDAGES/DRESSINGS) ×1
BNDG CMPR MED 15X6 ELC VLCR LF (GAUZE/BANDAGES/DRESSINGS) ×1
BNDG ELASTIC 6X10 VLCR STRL LF (GAUZE/BANDAGES/DRESSINGS) ×2 IMPLANT
BNDG ELASTIC 6X15 VLCR STRL LF (GAUZE/BANDAGES/DRESSINGS) ×1 IMPLANT
BOWL SMART MIX CTS (DISPOSABLE) IMPLANT
BSPLAT TIB 6 KN TRITANIUM (Knees) ×1 IMPLANT
CLSR STERI-STRIP ANTIMIC 1/2X4 (GAUZE/BANDAGES/DRESSINGS) ×3 IMPLANT
COVER SURGICAL LIGHT HANDLE (MISCELLANEOUS) ×2 IMPLANT
COVER WAND RF STERILE (DRAPES) ×1 IMPLANT
CUFF TOURN SGL QUICK 34 (TOURNIQUET CUFF) ×2
CUFF TRNQT CYL 34X4.125X (TOURNIQUET CUFF) ×1 IMPLANT
DECANTER SPIKE VIAL GLASS SM (MISCELLANEOUS) ×2 IMPLANT
DRAPE U-SHAPE 47X51 STRL (DRAPES) ×2 IMPLANT
DRSG MEPILEX BORDER 4X12 (GAUZE/BANDAGES/DRESSINGS) ×2 IMPLANT
DURAPREP 26ML APPLICATOR (WOUND CARE) ×4 IMPLANT
FEMORAL POSTERIOR SZ6 LFT (Femur) IMPLANT
GLOVE BIO SURGEON STRL SZ7.5 (GLOVE) ×4 IMPLANT
GLOVE BIOGEL PI IND STRL 8 (GLOVE) ×1 IMPLANT
GLOVE BIOGEL PI INDICATOR 8 (GLOVE) ×1
GOWN STRL REUS W/ TWL LRG LVL3 (GOWN DISPOSABLE) ×2 IMPLANT
GOWN STRL REUS W/TWL LRG LVL3 (GOWN DISPOSABLE) ×4
HANDPIECE INTERPULSE COAX TIP (DISPOSABLE) ×2
HOLDER FOLEY CATH W/STRAP (MISCELLANEOUS) ×1 IMPLANT
IMMOBILIZER KNEE 20 (SOFTGOODS) ×2
IMMOBILIZER KNEE 20 THIGH 36 (SOFTGOODS) IMPLANT
IMMOBILIZER KNEE 22 UNIV (SOFTGOODS) ×2 IMPLANT
INSERT TIB BEARING SZ6 9 (Miscellaneous) ×1 IMPLANT
KIT TURNOVER KIT A (KITS) IMPLANT
KNEE PATELLA ASYMMETRIC 10X32 (Knees) ×1 IMPLANT
KNEE TIBIAL COMPONENT SZ6 (Knees) ×1 IMPLANT
MANIFOLD NEPTUNE II (INSTRUMENTS) ×2 IMPLANT
NS IRRIG 1000ML POUR BTL (IV SOLUTION) ×2 IMPLANT
PACK ICE MAXI GEL EZY WRAP (MISCELLANEOUS) ×2 IMPLANT
PACK TOTAL KNEE CUSTOM (KITS) ×2 IMPLANT
PENCIL SMOKE EVACUATOR (MISCELLANEOUS) IMPLANT
PIN FLUTED HEDLESS FIX 3.5X1/8 (PIN) ×1 IMPLANT
POSTERIOR FEMORAL SZ6 LFT (Femur) ×2 IMPLANT
PROTECTOR NERVE ULNAR (MISCELLANEOUS) ×2 IMPLANT
SET HNDPC FAN SPRY TIP SCT (DISPOSABLE) ×1 IMPLANT
SUT MNCRL AB 4-0 PS2 18 (SUTURE) ×2 IMPLANT
SUT VIC AB 0 CT1 36 (SUTURE) ×2 IMPLANT
SUT VIC AB 1 CT1 36 (SUTURE) ×4 IMPLANT
SUT VIC AB 2-0 CT1 27 (SUTURE) ×2
SUT VIC AB 2-0 CT1 TAPERPNT 27 (SUTURE) ×1 IMPLANT
TRAY FOLEY MTR SLVR 14FR STAT (SET/KITS/TRAYS/PACK) IMPLANT
TRAY FOLEY MTR SLVR 16FR STAT (SET/KITS/TRAYS/PACK) ×1 IMPLANT
WRAP KNEE MAXI GEL POST OP (GAUZE/BANDAGES/DRESSINGS) ×1 IMPLANT
YANKAUER SUCT BULB TIP 10FT TU (MISCELLANEOUS) ×2 IMPLANT

## 2019-12-27 NOTE — Anesthesia Procedure Notes (Signed)
Anesthesia Regional Block: Adductor canal block   Pre-Anesthetic Checklist: ,, timeout performed, Correct Patient, Correct Site, Correct Laterality, Correct Procedure, Correct Position, site marked, Risks and benefits discussed,  Surgical consent,  Pre-op evaluation,  At surgeon's request and post-op pain management  Laterality: Left and Lower  Prep: chloraprep       Needles:  Injection technique: Single-shot  Needle Type: Echogenic Stimulator Needle     Needle Length: 9cm  Needle Gauge: 20   Needle insertion depth: 3 cm   Additional Needles:   Procedures:,,,, ultrasound used (permanent image in chart),,,,  Narrative:  Start time: 12/27/2019 7:04 AM End time: 12/27/2019 7:11 AM Injection made incrementally with aspirations every 5 mL.  Performed by: Personally  Anesthesiologist: Lyn Hollingshead, MD

## 2019-12-27 NOTE — Transfer of Care (Signed)
Immediate Anesthesia Transfer of Care Note  Patient: Joshua [name]  Procedure(s) Performed: TOTAL KNEE ARTHROPLASTY (Left Knee)  Patient Location: PACU  Anesthesia Type:Spinal  Level of Consciousness: awake, alert , oriented and patient cooperative  Airway & Oxygen Therapy: Patient Spontanous Breathing and Patient connected to nasal cannula oxygen  Post-op Assessment: Report given to RN and Post -op Vital signs reviewed and stable  Post vital signs: Reviewed and stable  Last Vitals:  Vitals Value Taken Time  BP    Temp    Pulse 70 12/27/19 1000  Resp 16 12/27/19 1000  SpO2 97 % 12/27/19 1000  Vitals shown include unvalidated device data.  Last Pain:  Vitals:   12/27/19 0606  TempSrc: Oral      Patients Stated Pain Goal: 4 (35/32/99 2426)  Complications: No complications documented.

## 2019-12-27 NOTE — Anesthesia Procedure Notes (Signed)
Spinal  Patient location during procedure: OR Start time: 12/27/2019 7:35 AM End time: 12/27/2019 7:40 AM Staffing Performed: anesthesiologist  Preanesthetic Checklist Completed: patient identified, IV checked, site marked, risks and benefits discussed, surgical consent, monitors and equipment checked, pre-op evaluation and timeout performed Spinal Block Patient position: sitting Prep: Betadine Patient monitoring: heart rate, continuous pulse ox and blood pressure Injection technique: single-shot Needle Needle type: Sprotte  Needle gauge: 24 G Needle length: 9 cm Needle insertion depth: 4 cm Assessment Sensory level: T10 Additional Notes Expiration date of kit checked and confirmed. Patient tolerated procedure well, without complications.

## 2019-12-27 NOTE — Discharge Instructions (Signed)
INSTRUCTIONS AFTER JOINT REPLACEMENT   o Remove items at home which could result in a fall. This includes throw rugs or furniture in walking pathways o ICE to the affected joint every three hours while awake for 30 minutes at a time, for at least the first 3-5 days, and then as needed for pain and swelling.  Continue to use ice for pain and swelling. You may notice swelling that will progress down to the foot and ankle.  This is normal after surgery.  Elevate your leg when you are not up walking on it.   o Continue to use the breathing machine you got in the hospital (incentive spirometer) which will help keep your temperature down.  It is common for your temperature to cycle up and down following surgery, especially at night when you are not up moving around and exerting yourself.  The breathing machine keeps your lungs expanded and your temperature down.   DIET:  As you were doing prior to hospitalization, we recommend a well-balanced diet.  DRESSING / WOUND CARE / SHOWERING  You may shower 3 days after surgery, but keep the wounds dry during showering.  You may use an occlusive plastic wrap (Press'n Seal for example), NO SOAKING/SUBMERGING IN THE BATHTUB.  If the bandage gets wet, change with a clean dry gauze.  If the incision gets wet, pat the wound dry with a clean towel.  ACTIVITY  o Increase activity slowly as tolerated, but follow the weight bearing instructions below.   o No driving for 6 weeks or until further direction given by your physician.  You cannot drive while taking narcotics.  o No lifting or carrying greater than 10 lbs. until further directed by your surgeon. o Avoid periods of inactivity such as sitting longer than an hour when not asleep. This helps prevent blood clots.  o You may return to work once you are authorized by your doctor.     WEIGHT BEARING   Weight bearing as tolerated with assist device (walker, cane, etc) as directed, use it as long as suggested by  your surgeon or therapist, typically at least 4-6 weeks.   EXERCISES  Results after joint replacement surgery are often greatly improved when you follow the exercise, range of motion and muscle strengthening exercises prescribed by your doctor. Safety measures are also important to protect the joint from further injury. Any time any of these exercises cause you to have increased pain or swelling, decrease what you are doing until you are comfortable again and then slowly increase them. If you have problems or questions, call your caregiver or physical therapist for advice.   Rehabilitation is important following a joint replacement. After just a few days of immobilization, the muscles of the leg can become weakened and shrink (atrophy).  These exercises are designed to build up the tone and strength of the thigh and leg muscles and to improve motion. Often times heat used for twenty to thirty minutes before working out will loosen up your tissues and help with improving the range of motion but do not use heat for the first two weeks following surgery (sometimes heat can increase post-operative swelling).   These exercises can be done on a training (exercise) mat, on the floor, on a table or on a bed. Use whatever works the best and is most comfortable for you.    Use music or television while you are exercising so that the exercises are a pleasant break in your day. This will   make your life better with the exercises acting as a break in your routine that you can look forward to.   Perform all exercises about fifteen times, three times per day or as directed.  You should exercise both the operative leg and the other leg as well.  Exercises include:   . Quad Sets - Tighten up the muscle on the front of the thigh (Quad) and hold for 5-10 seconds.   . Straight Leg Raises - With your knee straight (if you were given a brace, keep it on), lift the leg to 60 degrees, hold for 3 seconds, and slowly lower the  leg.  Perform this exercise against resistance later as your leg gets stronger.  . Leg Slides: Lying on your back, slowly slide your foot toward your buttocks, bending your knee up off the floor (only go as far as is comfortable). Then slowly slide your foot back down until your leg is flat on the floor again.  . Angel Wings: Lying on your back spread your legs to the side as far apart as you can without causing discomfort.  . Hamstring Strength:  Lying on your back, push your heel against the floor with your leg straight by tightening up the muscles of your buttocks.  Repeat, but this time bend your knee to a comfortable angle, and push your heel against the floor.  You may put a pillow under the heel to make it more comfortable if necessary.   A rehabilitation program following joint replacement surgery can speed recovery and prevent re-injury in the future due to weakened muscles. Contact your doctor or a physical therapist for more information on knee rehabilitation.    CONSTIPATION  Constipation is defined medically as fewer than three stools per week and severe constipation as less than one stool per week.  Even if you have a regular bowel pattern at home, your normal regimen is likely to be disrupted due to multiple reasons following surgery.  Combination of anesthesia, postoperative narcotics, change in appetite and fluid intake all can affect your bowels.   YOU MUST use at least one of the following options; they are listed in order of increasing strength to get the job done.  They are all available over the counter, and you may need to use some, POSSIBLY even all of these options:    Drink plenty of fluids (prune juice may be helpful) and high fiber foods Colace 100 mg by mouth twice a day  Senokot for constipation as directed and as needed Dulcolax (bisacodyl), take with full glass of water  Miralax (polyethylene glycol) once or twice a day as needed.  If you have tried all these things  and are unable to have a bowel movement in the first 3-4 days after surgery call either your surgeon or your primary doctor.    If you experience loose stools or diarrhea, hold the medications until you stool forms back up.  If your symptoms do not get better within 1 week or if they get worse, check with your doctor.  If you experience "the worst abdominal pain ever" or develop nausea or vomiting, please contact the office immediately for further recommendations for treatment.   ITCHING:  If you experience itching with your medications, try taking only a single pain pill, or even half a pain pill at a time.  You can also use Benadryl over the counter for itching or also to help with sleep.   TED HOSE STOCKINGS:  Use stockings   on both legs until for at least 2 weeks or as directed by physician office. They may be removed at night for sleeping.  MEDICATIONS:  See your medication summary on the "After Visit Summary" that nursing will review with you.  You may have some home medications which will be placed on hold until you complete the course of blood thinner medication.  It is important for you to complete the blood thinner medication as prescribed.  PRECAUTIONS:  If you experience chest pain or shortness of breath - call 911 immediately for transfer to the hospital emergency department.   If you develop a fever greater that 101 F, purulent drainage from wound, increased redness or drainage from wound, foul odor from the wound/dressing, or calf pain - CONTACT YOUR SURGEON.                                                   FOLLOW-UP APPOINTMENTS:  If you do not already have a post-op appointment, please call the office for an appointment to be seen by your surgeon.  Guidelines for how soon to be seen are listed in your "After Visit Summary", but are typically between 1-4 weeks after surgery.  OTHER INSTRUCTIONS:   Knee Replacement:  Do not place pillow under knee, focus on keeping the knee straight  while resting. CPM instructions: 0-90 degrees, 2 hours in the morning, 2 hours in the afternoon, and 2 hours in the evening. Place foam block, curve side up under heel at all times except when in CPM or when walking.  DO NOT modify, tear, cut, or change the foam block in any way.   DENTAL ANTIBIOTICS:  In most cases prophylactic antibiotics for Dental procdeures after total joint surgery are not necessary.  Exceptions are as follows:  1. History of prior total joint infection  2. Severely immunocompromised (Organ Transplant, cancer chemotherapy, Rheumatoid biologic meds such as Humera)  3. Poorly controlled diabetes (A1C &gt; 8.0, blood glucose over 200)  If you have one of these conditions, contact your surgeon for an antibiotic prescription, prior to your dental procedure.   MAKE SURE YOU:  . Understand these instructions.  . Get help right away if you are not doing well or get worse.    Thank you for letting us be a part of your medical care team.  It is a privilege we respect greatly.  We hope these instructions will help you stay on track for a fast and full recovery!    

## 2019-12-27 NOTE — Interval H&P Note (Signed)
History and Physical Interval Note:  12/27/2019 7:27 AM  Joshua [name]  has presented today for surgery, with the diagnosis of OA LEFT KNEE.  The various methods of treatment have been discussed with the patient and family. After consideration of risks, benefits and other options for treatment, the patient has consented to  Procedure(s): TOTAL KNEE ARTHROPLASTY (Left) as a surgical intervention.  The patient's history has been reviewed, patient examined, no change in status, stable for surgery.  I have reviewed the patient's chart and labs.  Questions were answered to the patient's satisfaction.     Renette Butters

## 2019-12-27 NOTE — Op Note (Signed)
DATE OF SURGERY:  12/27/2019 TIME: 9:26 AM  PATIENT NAME:  Joshua [name]   AGE: 74 y.o.    PRE-OPERATIVE DIAGNOSIS:  OA LEFT KNEE  POST-OPERATIVE DIAGNOSIS:  Same  PROCEDURE:  Procedure(s): TOTAL KNEE ARTHROPLASTY   SURGEON:  Renette Butters, MD   ASSISTANT:  Margy Clarks, PA-C, he was present and scrubbed throughout the case, critical for completion in a timely fashion, and for retraction, instrumentation, and closure.    OPERATIVE IMPLANTS: Stryker Triathlon Posterior Stabilized. Press fit knee  Femur size 6, Tibia size 6, Patella size 9 3-peg oval button, with a 9 mm polyethylene insert.   PREOPERATIVE INDICATIONS:  Joshua [name] is a 74 y.o. year old male with end stage bone on bone degenerative arthritis of the knee who failed conservative treatment, including injections, antiinflammatories, activity modification, and assistive devices, and had significant impairment of their activities of daily living, and elected for Total Knee Arthroplasty.   The risks, benefits, and alternatives were discussed at length including but not limited to the risks of infection, bleeding, nerve injury, stiffness, blood clots, the need for revision surgery, cardiopulmonary complications, among others, and they were willing to proceed.   OPERATIVE DESCRIPTION:  The patient was brought to the operative room and placed in a supine position.  General anesthesia was administered.  IV antibiotics were given.  The lower extremity was prepped and draped in the usual sterile fashion.  Time out was performed.  The leg was elevated and exsanguinated and the tourniquet was inflated.  Anterior approach was performed.  The patella was everted and osteophytes were removed.  The anterior horn of the medial and lateral meniscus was removed.   The distal femur was opened with the drill and the intramedullary distal femoral cutting jig was utilized, set at 5 degrees resecting 10 mm off the distal femur.   Care was taken to protect the collateral ligaments.  The distal femoral sizing jig was applied, taking care to avoid notching.  Then the 4-in-1 cutting jig was applied and the anterior and posterior femur was cut, along with the chamfer cuts.  All posterior osteophytes were removed.  The flexion gap was then measured and was symmetric with the extension gap.  Then the extramedullary tibial cutting jig was utilized making the appropriate cut using the anterior tibial crest as a reference building in appropriate posterior slope.  Care was taken during the cut to protect the medial and collateral ligaments.  The proximal tibia was removed along with the posterior horns of the menisci.  The PCL was sacrificed.    The extensor gap was measured and was approximately 30mm.    I completed the distal femoral preparation using the appropriate jig to prepare the box.  The patella was then measured, and cut with the saw.    The proximal tibia sized and prepared accordingly with the reamer and the punch, and then all components were trialed with the above sized poly insert.  The knee was found to have excellent balance and full motion.    The above named components were then impacted into place and Poly tibial piece and patella were inserted.  I was very happy with his stability and ROM  I performed a periarticular injection with marcaine and toradol  The knee was easily taken through a range of motion and the patella tracked well and the knee irrigated copiously and the parapatellar and subcutaneous tissue closed with vicryl, and monocryl with steri strips for the skin.  The incision was dressed with sterile gauze and the tourniquet released and the patient was awakened and returned to the PACU in stable and satisfactory condition.  There were no complications.  Total tourniquet time was roughly 75 minutes.   POSTOPERATIVE PLAN: post op Abx, DVT px: SCD's, TED's, Early ambulation and chemical  px

## 2019-12-27 NOTE — Evaluation (Addendum)
Physical Therapy Evaluation Patient Details Name: Joshua [name] MRN: [id number] DOB: 08/02/1945 Today's Date: 12/27/2019   History of Present Illness  Patient is 74 y.o. s/p L TKA . PMH: R DA THA6/21, significant for HTN, GERD, OA, testicular cancer.  Clinical Impression  Pt is s/p TKA resulting in the deficits listed below (see PT Problem List).  Pt is pleasant and motivated,  Performed stand pivot only today with assist of 2 for safety d/t L knee buckling with WBing (likely d/t residual effects of block). Anticipate steady progress in acute setting.   Pt will benefit from skilled PT to increase their independence and safety with mobility to allow discharge to the venue listed below.      Follow Up Recommendations Follow surgeon's recommendation for DC plan and follow-up therapies    Equipment Recommendations  None recommended by PT    Recommendations for Other Services       Precautions / Restrictions Precautions Precautions: Fall;Knee Restrictions Weight Bearing Restrictions: No      Mobility  Bed Mobility Overal bed mobility: Needs Assistance Bed Mobility: Supine to Sit     Supine to sit: Min guard     General bed mobility comments: for safety  Transfers Overall transfer level: Needs assistance Equipment used: Rolling walker (2 wheeled) Transfers: Sit to/from [facility] Sit to Stand: Min assist;Mod assist Stand pivot transfers: +2 safety/equipment;Min assist       General transfer comment: cues for safety and hand placement. assist to rise and transition to RW, L knee bucking with wt bearing so performed stand pivot only (amb deferred for pt safety), assist from PT to maintain L knee extension, cues for safety and use of UEs  Ambulation/Gait                Stairs            Wheelchair Mobility    Modified Rankin (Stroke Patients Only)       Balance     Sitting balance-Leahy Scale: Good       Standing balance-Leahy  Scale: Poor (reliant on UEs d/t residual effects of anesthesia)                               Pertinent Vitals/Pain Pain Assessment: 0-10 Pain Score: 2  Pain Location: left knee Pain Descriptors / Indicators: Discomfort;Aching Pain Intervention(s): Limited activity within patient's tolerance;Monitored during session;Premedicated before session;Repositioned    Home Living Family/patient expects to be discharged to:: Private residence Living Arrangements: Spouse/significant other Available Help at Discharge: Family Type of Home: House Home Access: Stairs to enter Entrance Stairs-Rails: None Entrance Stairs-Number of Steps: 2 Home Layout: Multi-level;Laundry or work area in basement;Able to live on main level with bedroom/bathroom Home Equipment: Kasandra Knudsen - single point;Walker - 2 wheels      Prior Function Level of Independence: Independent         Comments: pt uses SPC at times since R  THA in June  due to pain increase in Lt knee.  Pt enjoys racing cars as a hobby.     Hand Dominance        Extremity/Trunk Assessment   Upper Extremity Assessment Upper Extremity Assessment: Overall WFL for tasks assessed    Lower Extremity Assessment Lower Extremity Assessment: LLE deficits/detail LLE Deficits / Details: ankle WFL, knee extension 2+/5 with 10-15 degree quad lag.       Communication   Communication: [facility]  Cognition                                              General Comments      Exercises   ankle pumps x 10 Quad sets x 10 Heel slides x 10 AAROM Knee flexion ~ 6 to 55 degrees (had only 40 degrees flexion prior to surgery per pt)   Assessment/Plan    PT Assessment Patient needs continued PT services  PT Problem List Decreased strength;Decreased range of motion;Decreased activity tolerance;Decreased balance;Decreased mobility;Decreased knowledge of use of DME;Decreased knowledge of precautions       PT Treatment Interventions  DME instruction;Gait training;Stair training;Therapeutic activities;Functional mobility training;Therapeutic exercise;Balance training;Patient/family education    PT Goals (Current goals can be found in the Care Plan section)  Acute Rehab PT Goals Patient Stated Goal: get home and recover and return to independence PT Goal Formulation: With patient Time For Goal Achievement: 01/03/20 Potential to Achieve Goals: Good    Frequency 7X/week   Barriers to discharge        Co-evaluation               AM-PAC PT "6 Clicks" Mobility  Outcome Measure Help needed turning from your back to your side while in a flat bed without using bedrails?: A Little Help needed moving from lying on your back to sitting on the side of a flat bed without using bedrails?: A Little Help needed moving to and from a bed to a chair (including a wheelchair)?: A Lot Help needed standing up from a chair using your arms (e.g., wheelchair or bedside chair)?: A Lot Help needed to walk in hospital room?: A Lot Help needed climbing 3-5 steps with a railing? : Total 6 Click Score: 13    End of Session Equipment Utilized During Treatment: Gait belt Activity Tolerance: Patient tolerated treatment well Patient left: in chair;with call bell/phone within reach;with family/visitor present;with chair alarm set   PT Visit Diagnosis: Other abnormalities of gait and mobility (R26.89)    Time: 8101-7510 PT Time Calculation (min) (ACUTE ONLY): 25 min   Charges:   PT Evaluation $PT Eval Low Complexity: 1 Low PT Treatments $Therapeutic Activity: 8-22 mins        Baxter Flattery, PT  Acute Rehab Dept ([place]) [phone] Pager [phone]  12/27/2019   [facility] 12/27/2019, 5:00 PM

## 2019-12-27 NOTE — Anesthesia Postprocedure Evaluation (Signed)
Anesthesia Post Note  Patient: Joshua [name]  Procedure(s) Performed: TOTAL KNEE ARTHROPLASTY (Left Knee)     Patient location during evaluation: PACU Anesthesia Type: Spinal Level of consciousness: awake Pain management: pain level controlled Vital Signs Assessment: post-procedure vital signs reviewed and stable Respiratory status: spontaneous breathing Cardiovascular status: stable Postop Assessment: no headache, no backache, spinal receding, patient able to bend at knees and no apparent nausea or vomiting Anesthetic complications: no   No complications documented.  Last Vitals:  Vitals:   12/27/19 1030 12/27/19 1045  BP: 121/81 112/73  Pulse: (!) 59 60  Resp: 12 12  Temp:    SpO2: 99% 98%    Last Pain:  Vitals:   12/27/19 1045  TempSrc:   PainSc: 0-No pain                 Huston Foley

## 2019-12-28 ENCOUNTER — Encounter (HOSPITAL_COMMUNITY): Payer: Self-pay | Admitting: Orthopedic Surgery

## 2019-12-28 MED ORDER — SODIUM CHLORIDE 0.9 % IV BOLUS
1000.0000 mL | Freq: Once | INTRAVENOUS | Status: AC
  Administered 2019-12-28: 1000 mL via INTRAVENOUS

## 2019-12-28 MED ORDER — BACLOFEN 10 MG PO TABS: 10.0000 mg | ORAL_TABLET | Freq: Three times a day (TID) | ORAL | 0 refills | Status: DC | PRN

## 2019-12-28 MED ORDER — ONDANSETRON HCL 4 MG PO TABS: 4.0000 mg | ORAL_TABLET | Freq: Three times a day (TID) | ORAL | Status: DC | PRN

## 2019-12-28 MED ORDER — ONDANSETRON HCL 4 MG/2ML IJ SOLN: 4.0000 mg | Freq: Three times a day (TID) | INTRAMUSCULAR | Status: DC | PRN

## 2019-12-28 MED ORDER — HYDROCODONE-ACETAMINOPHEN 5-325 MG PO TABS: 1.0000 | ORAL_TABLET | Freq: Four times a day (QID) | ORAL | Status: DC | PRN

## 2019-12-28 MED ORDER — ACETAMINOPHEN 325 MG PO TABS: 325.0000 mg | ORAL_TABLET | Freq: Four times a day (QID) | ORAL | 0 refills | Status: AC | PRN

## 2019-12-28 MED ORDER — METHOCARBAMOL 500 MG PO TABS
500.0000 mg | ORAL_TABLET | Freq: Four times a day (QID) | ORAL | Status: DC | PRN
  Administered 2019-12-28 – 2019-12-29 (×2): 500 mg via ORAL
  Filled 2019-12-28 (×2): qty 1

## 2019-12-28 MED ORDER — ONDANSETRON HCL 4 MG/2ML IJ SOLN: 4.0000 mg | Freq: Three times a day (TID) | INTRAMUSCULAR | 0 refills | Status: DC

## 2019-12-28 MED ORDER — GABAPENTIN 100 MG PO CAPS: 100.0000 mg | ORAL_CAPSULE | Freq: Two times a day (BID) | ORAL | Status: DC | PRN

## 2019-12-28 MED ORDER — ASPIRIN EC 81 MG PO TBEC
81.0000 mg | DELAYED_RELEASE_TABLET | Freq: Two times a day (BID) | ORAL | Status: DC
  Administered 2019-12-28: 81 mg via ORAL
  Filled 2019-12-28: qty 1

## 2019-12-28 MED ORDER — PANTOPRAZOLE SODIUM 40 MG PO TBEC: 40.0000 mg | DELAYED_RELEASE_TABLET | Freq: Every day | ORAL | Status: DC

## 2019-12-28 MED ORDER — BACLOFEN 10 MG PO TABS: 10.0000 mg | ORAL_TABLET | Freq: Three times a day (TID) | ORAL | Status: DC | PRN

## 2019-12-28 MED ORDER — ONDANSETRON HCL 4 MG/2ML IJ SOLN: 4.0000 mg | Freq: Three times a day (TID) | INTRAMUSCULAR | 0 refills | Status: DC | PRN

## 2019-12-28 MED ORDER — HYDROCODONE-ACETAMINOPHEN 5-325 MG PO TABS: 1.0000 | ORAL_TABLET | ORAL | 0 refills | Status: DC | PRN

## 2019-12-28 NOTE — Plan of Care (Signed)
Patient due to void by noon today, continuing to monitor for complications/urinary retention. Deanna Artis, RN

## 2019-12-28 NOTE — Progress Notes (Signed)
Occupational Therapy Evaluation  Patient with functional deficits listed below impacting safety and independence with self care. Patient unable to slide L LE along bed requiring min A for bed mobility. Patient min A for stability with sit to stand and transfer to recliner. Patient tolerate sitting upright in recliner for approx 10 mins during LB dressing task using reacher and sock aid to practice doff/don socks. At end of task patient reports light headed and becomes pale, patietn reclined into chair with BP taken 103/62. Made RN aware. Continue to recommend acute OT to maximize patient safety and independence with functional transfers and ADLs.    12/28/19 1100  OT Visit Information  Last OT Received On 12/28/19  Assistance Needed +1  History of Present Illness Patient is 74 y.o. s/p L TKA . PMH: R DA THA6/21, significant for HTN, GERD, OA, testicular cancer.  Precautions  Precautions Fall;Knee  Restrictions  Weight Bearing Restrictions Yes  LLE Weight Bearing WBAT  Home Living  Family/patient expects to be discharged to: Private residence  Living Arrangements Spouse/significant other  Available Help at Discharge Family  Type of [place] to enter  Entrance Stairs-Number of Steps 2  Entrance Stairs-Rails None  Home Layout Multi-level;Laundry or work area in basement;Able to live on main level with bedroom/bathroom  Teacher, English as a foreign language Yes  How Accessible Accessible via Scientist, clinical (histocompatibility and immunogenetics) - single point;Walker - 2 wheels  Prior Function  Level of Independence Needs assistance  Gait / Transfers Assistance Needed ambulates with a cane  ADL's / Homemaking Assistance Needed patient reports spouse has been helping with socks   Comments pt uses SPC at times since R  THA in June  due to pain increase in Lt knee.  Pt enjoys racing cars as a hobby.  Communication  Communication HOH  Pain  Assessment  Pain Assessment Faces  Faces Pain Scale 2  Pain Location left knee  Pain Descriptors / Indicators Discomfort;Aching  Pain Intervention(s) Monitored during session  Cognition  Arousal/Alertness Awake/alert  Behavior During Therapy WFL for tasks assessed/performed  Overall Cognitive Status Within Functional Limits for tasks assessed  Upper Extremity Assessment  Upper Extremity Assessment Overall WFL for tasks assessed  Lower Extremity Assessment  Lower Extremity Assessment Defer to PT evaluation  ADL  Overall ADL's  Needs assistance/impaired  Grooming Set up;Sitting  Upper Body Bathing Set up;Sitting  Lower Body Bathing Sitting/lateral leans;Sit to/from stand;Minimal assistance  Upper Body Dressing  Set up;Sitting  Lower Body Dressing Sitting/lateral leans;Sit to/from stand;Minimal assistance  Lower Body Dressing Details (indicate cue type and reason) educate patient in benefits of AE, patient return demo with reacher and sock aid to doff/don R sock with min cues for technique  Toilet Transfer Minimal assistance;Cueing for sequencing;Cueing for safety;Stand-pivot;BSC;RW  Toilet Transfer Details (indicate cue type and reason) transfer to recliner, min A for steadying assist  Toileting- Clothing Manipulation and Hygiene Minimal assistance;Sit to/from stand;Sitting/lateral lean  Functional mobility during ADLs Minimal assistance;Rolling walker;Cueing for safety;Cueing for sequencing  General ADL Comments patient requiring increased assistance with self care due to pain in L knee, decreased balance, mobility, safety awareness  Bed Mobility  Overal bed mobility Needs Assistance  Bed Mobility Supine to Sit  Supine to sit Min assist  General bed mobility comments to manage L LE   Transfers  Overall transfer level Needs assistance  Equipment used Rolling walker (2 wheeled)  Transfers Sit to/from [facility]  Sit to Stand Min assist  Stand pivot transfers Min  assist  General transfer comment min A for steadying, cues for safety and sequencing to chair.   Balance  Overall balance assessment Needs assistance  Sitting-balance support Feet supported  Sitting balance-Leahy Scale Good  Standing balance support Bilateral upper extremity supported  Standing balance-Leahy Scale Poor  General Comments  General comments (skin integrity, edema, etc.) patient became light headed with LB Dressing task in chair, patient reclined into chair BP taken 103/62. Notified RN  OT - End of Session  Equipment Utilized During Treatment Rolling walker;Gait belt  Activity Tolerance Patient tolerated treatment well  Patient left in chair;with call bell/phone within reach;with chair alarm set  Nurse Communication Mobility status;Other (comment) (light headed with activity)  OT Assessment  OT Recommendation/Assessment Patient needs continued OT Services  OT Visit Diagnosis Pain;Other abnormalities of gait and mobility (R26.89)  Pain - Right/Left Left  Pain - part of body Knee  OT Problem List Pain;Decreased activity tolerance;Impaired balance (sitting and/or standing);Decreased safety awareness;Decreased knowledge of use of DME or AE  OT Plan  OT Frequency (ACUTE ONLY) Min 2X/week  OT Treatment/Interventions (ACUTE ONLY) Self-care/ADL training;Energy conservation;DME and/or AE instruction;Therapeutic activities;Patient/family education;Balance training  AM-PAC OT "6 Clicks" Daily Activity Outcome Measure (Version 2)  Help from another person eating meals? 4  Help from another person taking care of personal grooming? 3  Help from another person toileting, which includes using toliet, bedpan, or urinal? 3  Help from another person bathing (including washing, rinsing, drying)? 3  Help from another person to put on and taking off regular upper body clothing? 3  Help from another person to put on and taking off regular lower body clothing? 3  6 Click Score 19  OT  Recommendation  Follow Up Recommendations Follow surgeons recommendation for DC plan and follow-up therapies  OT Equipment Other (comment) (Adaptive equipment reacher and sock aid)  Individuals Consulted  Consulted and Agree with Results and Recommendations Patient  Acute Rehab OT Goals  Patient Stated Goal get home and recover and return to independence  OT Goal Formulation With patient  Time For Goal Achievement 01/11/20  Potential to Achieve Goals Good  OT Time Calculation  OT Start Time (ACUTE ONLY) 0804  OT Stop Time (ACUTE ONLY) 0842  OT Time Calculation (min) 38 min  OT General Charges  $OT Visit 1 Visit  OT Evaluation  $OT Eval Low Complexity 1 Low  OT Treatments  $Self Care/Home Management  23-37 mins  Written Expression  Dominant Hand Right   Delbert Phenix OT OT pager: [phone]

## 2019-12-28 NOTE — Discharge Summary (Addendum)
Discharge Summary  Patient ID: Joshua [name] MRN: [id number] DOB/AGE: [birth year] 74 y.o.  Admit date: 12/27/2019 Discharge date: 12/28/2019  Admission Diagnoses:  Primary osteoarthritis of left knee  Discharge Diagnoses:  Principal Problem:   Primary osteoarthritis of left knee Active Problems:   History of bleeding ulcers   History of testicular cancer   HTN (hypertension)   HLD (hyperlipidemia)   History of total hip arthroplasty, right   S/P left knee surgery   Past Medical History:  Diagnosis Date  . Arthritis   . Cancer [facility])    Testicular   . GERD (gastroesophageal reflux disease)   . 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 Hypertension     Surgeries: Procedure(s): TOTAL KNEE ARTHROPLASTY on 12/27/2019   Consultants (if any):   Discharged Condition: Improved  Hospital Course: JOSECARLOS HARRIOTT is an 74 y.o. male who was admitted 12/27/2019 with a diagnosis of Primary osteoarthritis of left knee and went to the operating room on 12/27/2019 and underwent the above named procedures.     He was given perioperative antibiotics:  Anti-infectives (From admission, onward)   Start     Dose/Rate Route Frequency Ordered Stop   12/27/19 0600  ceFAZolin (ANCEF) IVPB 2g/100 mL premix        2 g 200 mL/hr over 30 Minutes Intravenous On call to O.R. 12/27/19 0544 12/27/19 0753   12/27/19 0546  ceFAZolin (ANCEF) 2-4 GM/100ML-% IVPB       Note to Pharmacy: Randa Evens  : cabinet override      12/27/19 0546 12/27/19 0751    .  He was given sequential compression devices, early ambulation, and ASA for DVT prophylaxis.  He benefited maximally from the hospital stay and there were no complications.    Recent vital signs:  Vitals:   12/28/19 0214 12/28/19 0534  BP: 124/74 126/76  Pulse: 77 73  Resp: 16 18  Temp: 97.8 F (36.6 C) 97.7 F (36.5 C)  SpO2: 98% 97%    Recent laboratory studies:  Lab Results  Component Value Date   HGB 14.1 12/19/2019    HGB 14.6 10/26/2019   HGB 14.0 11/08/2009   Lab Results  Component Value Date   WBC 6.1 12/19/2019   PLT 173 12/19/2019   Lab Results  Component Value Date   INR 1.1 12/19/2019   Lab Results  Component Value Date   NA 141 12/19/2019   K 4.3 12/19/2019   CL 103 12/19/2019   CO2 28 12/19/2019   BUN 15 12/19/2019   CREATININE 0.80 12/19/2019   GLUCOSE 102 (H) 12/19/2019    Discharge Medications:   Allergies as of 12/28/2019   No Known Allergies     Medication List    STOP taking these medications   amLODipine 10 MG tablet Commonly known as: NORVASC   atorvastatin 20 MG tablet Commonly known as: LIPITOR   b complex vitamins tablet   gabapentin 100 MG capsule Commonly known as: Neurontin   hydrochlorothiazide 25 MG tablet Commonly known as: HYDRODIURIL   levothyroxine 112 MCG tablet Commonly known as: SYNTHROID   losartan 100 MG tablet Commonly known as: COZAAR   metoprolol succinate 100 MG 24 hr tablet Commonly known as: TOPROL-XL   VITAMIN B-12 PO   Voltaren 1 % Gel Generic drug: diclofenac Sodium     TAKE these medications   acetaminophen 325 MG tablet Commonly known as: TYLENOL Take 1-2 tablets (325-650 mg total) by mouth every 6 (six) hours as  needed for mild pain (pain score 1-3 or temp > 100.5). What changed:   medication strength  how much to take  reasons to take this   aspirin EC 81 MG tablet Take 1 tablet (81 mg total) by mouth in the morning and at bedtime. Swallow whole. What changed:   when to take this  additional instructions   baclofen 10 MG tablet Commonly known as: LIORESAL Take 1 tablet (10 mg total) by mouth 2 (two) times daily for 14 days. What changed:   when to take this  reasons to take this   celecoxib 200 MG capsule Commonly known as: CeleBREX Take 1 capsule (200 mg total) by mouth 2 (two) times daily.   HYDROcodone-acetaminophen 5-325 MG tablet Commonly known as: NORCO/VICODIN Take 1 tablet by mouth  every 4 (four) hours as needed for up to 14 days for moderate pain. What changed:   how much to take  when to take this  reasons to take this   HYDROcodone-acetaminophen 5-325 MG tablet Commonly known as: NORCO/VICODIN Take 1-2 tablets by mouth every 4 (four) hours as needed for moderate pain (pain score 4-6). What changed: You were already taking a medication with the same name, and this prescription was added. Make sure you understand how and when to take each.   omeprazole 20 MG capsule Commonly known as: PriLOSEC Take 1 capsule (20 mg total) by mouth daily. 30 days for gastroprotection while taking NSAIDs.   ondansetron 4 MG tablet Commonly known as: Zofran Take 1 tablet (4 mg total) by mouth every 8 (eight) hours as needed for up to 14 days for nausea or vomiting. What changed: Another medication with the same name was added. Make sure you understand how and when to take each.   ondansetron 4 MG/2ML Soln injection Commonly known as: ZOFRAN Inject 2 mLs (4 mg total) into the vein every 8 (eight) hours. What changed: You were already taking a medication with the same name, and this prescription was added. Make sure you understand how and when to take each.       Diagnostic Studies: No results found.  Disposition: Discharge disposition: 01-Home or Self Care       Discharge Instructions    Call MD / Call 911   Complete by: As directed    If you experience chest pain or shortness of breath, CALL 911 and be transported to the hospital emergency room.  If you develope a fever above 101 F, pus (white drainage) or increased drainage or redness at the wound, or calf pain, call your surgeon's office.   Constipation Prevention   Complete by: As directed    Drink plenty of fluids.  Prune juice may be helpful.  You may use a stool softener, such as Colace (over the counter) 100 mg twice a day.  Use MiraLax (over the counter) for constipation as needed.   Diet - low sodium heart  healthy   Complete by: As directed    Discharge instructions   Complete by: As directed    INSTRUCTIONS AFTER JOINT REPLACEMENT   Remove items at home which could result in a fall. This includes throw rugs or furniture in walking pathways ICE to the affected joint every three hours while awake for 30 minutes at a time, for at least the first 3-5 days, and then as needed for pain and swelling.  Continue to use ice for pain and swelling. You may notice swelling that will progress down to the foot and  ankle.  This is normal after surgery.  Elevate your leg when you are not up walking on it.   Continue to use the breathing machine you got in the hospital (incentive spirometer) which will help keep your temperature down.  It is common for your temperature to cycle up and down following surgery, especially at night when you are not up moving around and exerting yourself.  The breathing machine keeps your lungs expanded and your temperature down.   DIET:  As you were doing prior to hospitalization, we recommend a well-balanced diet.  DRESSING / WOUND CARE / SHOWERING Maintain dressings until follow up in the office May shower on Friday. Cover dressing with saran wrap so water does not get on dressings. If water does get UNDER dressing REMOVE DRESSINGS, pat incision dry and replace with CLEAN, DRY dressings. Please all the office to let us know. We may have you come in for a new dressing.  ACTIVITY  Increase activity slowly as tolerated, but follow the weight bearing instructions below.   No driving for 6 weeks or until further direction given by your physician.  You cannot drive while taking narcotics.  No lifting or carrying greater than 10 lbs. until further directed by your surgeon. Avoid periods of inactivity such as sitting longer than an hour when not asleep. This helps prevent blood clots.  You may return to work once you are authorized by your doctor.     WEIGHT BEARING   You may bare  weight on your operative leg as tolerated.    EXERCISES  Results after joint replacement surgery are often greatly improved when you follow the exercise, range of motion and muscle strengthening exercises prescribed by your doctor. Safety measures are also important to protect the joint from further injury. Any time any of these exercises cause you to have increased pain or swelling, decrease what you are doing until you are comfortable again and then slowly increase them. If you have problems or questions, call your caregiver or physical therapist for advice.   Rehabilitation is important following a joint replacement. After just a few days of immobilization, the muscles of the leg can become weakened and shrink (atrophy).  These exercises are designed to build up the tone and strength of the thigh and leg muscles and to improve motion. Often times heat used for twenty to thirty minutes before working out will loosen up your tissues and help with improving the range of motion but do not use heat for the first two weeks following surgery (sometimes heat can increase post-operative swelling).   These exercises can be done on a training (exercise) mat, on the floor, on a table or on a bed. Use whatever works the best and is most comfortable for you.    Use music or television while you are exercising so that the exercises are a pleasant break in your day. This will make your life better with the exercises acting as a break in your routine that you can look forward to.   Perform all exercises about fifteen times, three times per day or as directed.  You should exercise both the operative leg and the other leg as well.  Exercises include:   Quad Sets - Tighten up the muscle on the front of the thigh (Quad) and hold for 5-10 seconds.   Straight Leg Raises - With your knee straight (if you were given a brace, keep it on), lift the leg to 60 degrees, hold for 3  seconds, and slowly lower the leg.  Perform this  exercise against resistance later as your leg gets stronger.  Leg Slides: Lying on your back, slowly slide your foot toward your buttocks, bending your knee up off the floor (only go as far as is comfortable). Then slowly slide your foot back down until your leg is flat on the floor again.  Angel Wings: Lying on your back spread your legs to the side as far apart as you can without causing discomfort.  Hamstring Strength:  Lying on your back, push your heel against the floor with your leg straight by tightening up the muscles of your buttocks.  Repeat, but this time bend your knee to a comfortable angle, and push your heel against the floor.  You may put a pillow under the heel to make it more comfortable if necessary.   A rehabilitation program following joint replacement surgery can speed recovery and prevent re-injury in the future due to weakened muscles. Contact your doctor or a physical therapist for more information on knee rehabilitation.   You have a Constant passive motion machine. Please use it for at least two hours per day for 2 weeks.    CONSTIPATION  Constipation is defined medically as fewer than three stools per week and severe constipation as less than one stool per week.  Even if you have a regular bowel pattern at home, your normal regimen is likely to be disrupted due to multiple reasons following surgery.  Combination of anesthesia, postoperative narcotics, change in appetite and fluid intake all can affect your bowels.   YOU MUST use at least one of the following options; they are listed in order of increasing strength to get the job done.  They are all available over the counter, and you may need to use some, POSSIBLY even all of these options:    Drink plenty of fluids (prune juice may be helpful) and high fiber foods Colace 100 mg by mouth twice a day  Senokot for constipation as directed and as needed Dulcolax (bisacodyl), take with full glass of water  Miralax  (polyethylene glycol) once or twice a day as needed.  If you have tried all these things and are unable to have a bowel movement in the first 3-4 days after surgery call either your surgeon or your primary doctor.    If you experience loose stools or diarrhea, hold the medications until you stool forms back up.  If your symptoms do not get better within 1 week or if they get worse, check with your doctor.  If you experience "the worst abdominal pain ever" or develop nausea or vomiting, please contact the office immediately for further recommendations for treatment.   ITCHING:  If you experience itching with your medications, try taking only a single pain pill, or even half a pain pill at a time.  You can also use Benadryl over the counter for itching or also to help with sleep.   TED HOSE STOCKINGS:  Use stockings on both legs until for at least 2 weeks or as directed by physician office. They may be removed at night for sleeping.  MEDICATIONS:  See your medication summary on the "After Visit Summary" that nursing will review with you.  You may have some home medications which will be placed on hold until you complete the course of blood thinner medication.  It is important for you to complete the blood thinner medication as prescribed.  PRECAUTIONS:  If you experience chest  pain or shortness of breath - call 911 immediately for transfer to the hospital emergency department.   If you develop a fever greater that 101 F, purulent drainage from wound, increased redness or drainage from wound, foul odor from the wound/dressing, or calf pain - CONTACT YOUR SURGEON.                                                   FOLLOW-UP APPOINTMENTS:  If you do not already have a post-op appointment, please call the office for an appointment to be seen by your surgeon.  Guidelines for how soon to be seen are listed in your "After Visit Summary", but are typically between 1-4 weeks after surgery.  OTHER  INSTRUCTIONS:   Knee Replacement:  Do not place pillow under knee, focus on keeping the knee straight while resting. CPM instructions: 0-90 degrees, 2 hours in the morning, 2 hours in the afternoon, and 2 hours in the evening. Place foam block, curve side up under heel at all times except when in CPM or when walking.  DO NOT modify, tear, cut, or change the foam block in any way.   DENTAL ANTIBIOTICS:  In most cases prophylactic antibiotics for Dental procdeures after total joint surgery are not necessary.  Exceptions are as follows:  1. History of prior total joint infection  2. Severely immunocompromised (Organ Transplant, cancer chemotherapy, Rheumatoid biologic meds such as [place])  3. Poorly controlled diabetes (A1C > 8.0, blood glucose over 200)  If you have one of these conditions, contact your surgeon for an antibiotic prescription, prior to your dental procedure.   MAKE SURE YOU:  Understand these instructions.  Get help right away if you are not doing well or get worse.    Thank you for letting us be a part of your medical care team.  It is a privilege we respect greatly.  We hope these instructions will help you stay on track for a fast and full recovery!   Driving restrictions   Complete by: As directed    No driving for 6 weeks   Increase activity slowly as tolerated   Complete by: As directed    Lifting restrictions   Complete by: As directed    No lifting for more then 10 lbs for 6 weeks   TED hose   Complete by: As directed    Use stockings (TED hose) for 6 weeks on both leg(s).  You may remove them at night for sleeping.       Follow-up Information    Renette Butters, MD In 2 weeks.   Specialty: Orthopedic Surgery Contact information: [address] [place] [address] [phone]               Pt. Seen and examined today. Stable for d/c so long has he passes PT/OT.  Signed: Rachael Fee PA-C 12/28/2019, 7:40  AM

## 2019-12-28 NOTE — Progress Notes (Signed)
    Subjective: Patient reports pain as mild.  Tolerating diet.  Urinating.  +Flatus.  No CP, SOB.  Paraesthesia  OOB with PT  Objective:   VITALS:   Vitals:   12/27/19 1532 12/27/19 1623 12/28/19 0214 12/28/19 0534  BP: 127/74 122/79 124/74 126/76  Pulse: 70 74 77 73  Resp: 17 17 16 18   Temp: 97.8 F (36.6 C) 97.8 F (36.6 C) 97.8 F (36.6 C) 97.7 F (36.5 C)  TempSrc: Oral Oral Oral Oral  SpO2: 97% 97% 98% 97%  Weight:      Height:       CBC Latest Ref Rng & Units 12/19/2019 10/26/2019 11/08/2009  WBC 4.0 - 10.5 K/uL 6.1 6.6 7.3  Hemoglobin 13.0 - 17.0 g/dL 14.1 14.6 14.0  Hematocrit 39 - 52 % 41.2 43.5 39.8  Platelets 150 - 400 K/uL 173 157 151   BMP Latest Ref Rng & Units 12/19/2019 10/26/2019 11/08/2009  Glucose 70 - 99 mg/dL 102(H) 99 96  BUN 8 - 23 mg/dL 15 18 17   Creatinine 0.61 - 1.24 mg/dL 0.80 0.94 0.8  Sodium 135 - 145 mmol/L 141 136 143  Potassium 3.5 - 5.1 mmol/L 4.3 4.4 3.5  Chloride 98 - 111 mmol/L 103 100 105  CO2 22 - 32 mmol/L 28 26 26   Calcium 8.9 - 10.3 mg/dL 9.6 9.3 9.3   Intake/Output      08/03 0701 - 08/04 0700 08/04 0701 - 08/05 0700   P.O. 1080    I.V. (mL/kg) 3198.3 (27.6)    Total Intake(mL/kg) 4278.3 (37)    Urine (mL/kg/hr) 2000 (0.7)    Stool 0    Blood 20    Total Output 2020    Net +2258.3         Stool Occurrence 0 x       Physical Exam: General: NAD.  Resting in bed, AA) x 4 Resp: No increased wob Cardio: regular rate and rhythm ABD soft Neurologically intact MSK Neurovascularly intact Sensation intact distally Intact pulses distally Dorsiflexion/Plantar flexion intact Incision: dressing C/D/I Neurovascular intact Dorsiflexion/Plantar flexion intact Incision: scant drainage Compartment soft  Assessment: 1 Day Post-Op  S/P Procedure(s) (LRB): TOTAL KNEE ARTHROPLASTY (Left) by Dr. Ernesta Amble. Percell Miller on 12/27/2019  Principal Problem:   Primary osteoarthritis of left knee Active Problems:   History of bleeding  ulcers   History of testicular cancer   HTN (hypertension)   HLD (hyperlipidemia)   History of total hip arthroplasty, right   S/P left knee surgery    Plan:   Discharge home with home health Incentive Spirometry Elevate and Apply ice  Weightbearing: WBAT LLE Insicional and dressing care: Dressings left intact until follow-up Orthopedic device(s): None Showering: Keep dressing dry VTE prophylaxis: Aspirin 81mg  BID 30 days, SCDs, ambulation Pain control: Norco, Tylenol, Celebrex Follow - up plan: 2 weeks Contact information:  Edmonia Lynch MD, Roxan Hockey PA-C  Dispo: Home     Rachael Fee, [place] 12/28/2019, 7:37 AM

## 2019-12-28 NOTE — Progress Notes (Signed)
Physical Therapy Treatment Patient Details Name: Joshua [name] MRN: [id number] DOB: 07/22/1945 Today's Date: 12/28/2019    History of Present Illness Patient is 74 y.o. s/p L TKA . PMH: R DA THA6/21, significant for HTN, GERD, OA, testicular cancer.    PT Comments    Pt with low BP and receiving bolus this morning.  Pt in pain as well and received pain meds after bolus.  Pt still reports pain with mobility specifically weight bearing and knee flexion.  Pt only tolerated short distance ambulation.  Pt does not appear safe/ready for d/c home today.  Pt will also need to practice steps prior to d/c.    Follow Up Recommendations  Follow surgeon's recommendation for DC plan and follow-up therapies     Equipment Recommendations  None recommended by PT    Recommendations for Other Services       Precautions / Restrictions Precautions Precautions: Fall;Knee Restrictions Weight Bearing Restrictions: No LLE Weight Bearing: Weight bearing as tolerated    Mobility  Bed Mobility Overal bed mobility: Needs Assistance Bed Mobility: Sit to Supine       Sit to supine: Min guard      Transfers Overall transfer level: Needs assistance Equipment used: Rolling walker (2 wheeled) Transfers: Sit to/from Stand Sit to Stand: Min guard         General transfer comment: min/guard for safety, pt able to recall hand placement, cue for L LE forward for pain control  Ambulation/Gait Ambulation/Gait assistance: Min assist Gait Distance (Feet): 36 Feet Assistive device: Rolling walker (2 wheeled) Gait Pattern/deviations: Step-to pattern;Antalgic;Decreased stance time - left Gait velocity: decreased   General Gait Details: verbal cues for sequence, RW positioning, use of UEs on RW for pain control; distance limited by pain   Stairs             Wheelchair Mobility    Modified Rankin (Stroke Patients Only)       Balance                                             Cognition Arousal/Alertness: Awake/alert Behavior During Therapy: WFL for tasks assessed/performed Overall Cognitive Status: Within Functional Limits for tasks assessed                                        Exercises Total Joint Exercises Quad Sets: AROM;10 reps;Left Heel Slides: AAROM;Left;10 reps Hip ABduction/ADduction: Left;10 reps;AROM Straight Leg Raises: AAROM;Left;10 reps Goniometric ROM: approx 6-15* AAROM left knee    General Comments        Pertinent Vitals/Pain Pain Assessment: 0-10 Pain Score: 6  (2 at rest) Pain Location: left knee Pain Descriptors / Indicators: Discomfort;Aching Pain Intervention(s): Repositioned;Monitored during session;Premedicated before session    Home Living                      Prior Function            PT Goals (current goals can now be found in the care plan section) Progress towards PT goals: Progressing toward goals    Frequency    7X/week      PT Plan Current plan remains appropriate    Co-evaluation  AM-PAC PT "6 Clicks" Mobility   Outcome Measure  Help needed turning from your back to your side while in a flat bed without using bedrails?: A Little Help needed moving from lying on your back to sitting on the side of a flat bed without using bedrails?: A Little Help needed moving to and from a bed to a chair (including a wheelchair)?: A Little Help needed standing up from a chair using your arms (e.g., wheelchair or bedside chair)?: A Little Help needed to walk in hospital room?: A Lot Help needed climbing 3-5 steps with a railing? : A Lot 6 Click Score: 16    End of Session Equipment Utilized During Treatment: Gait belt Activity Tolerance: Patient tolerated treatment well Patient left: with call bell/phone within reach;in bed;with family/visitor present;with bed alarm set Nurse Communication: Mobility status PT Visit Diagnosis: Other abnormalities of gait and  mobility (R26.89)     Time: 6301-6010 PT Time Calculation (min) (ACUTE ONLY): 34 min  Charges:  $Gait Training: 8-22 mins $Therapeutic Exercise: 8-22 mins                    Joshua [name] PT, DPT Acute Rehabilitation Services Pager: [phone] Office: [phone]  Joshua [name],KATHrine E 12/28/2019, 3:03 PM

## 2019-12-28 NOTE — TOC Transition Note (Signed)
Transition of Care [facility]) - CM/SW Discharge Note   Patient Details  Name: Joshua [name] MRN: [id number] Date of Birth: May 04, 1946  [facility] of [facility]) CM/SW Contact:  Lia Hopping, [place] Phone Number: 12/28/2019, 10:14 AM   Clinical Narrative:    Therapy Plan: [facility] Patient was given equipment Pre-Op.    Final next level of care: [place] Barriers to Discharge: No Barriers Identified   Patient Goals and CMS Choice        Discharge Placement                       Discharge Plan and Services                            [place]: [facility] Date [place]: 12/28/19 Time [place]: 1013 Representative spoke with at [place]: [place] ([place]) Interventions     Readmission Risk Interventions No flowsheet data found.

## 2019-12-28 NOTE — Progress Notes (Signed)
Pt Bp decreased while working with OT. Pt felt dizzy, placed back in recliner and rechecked BP. Pt states he 'feels better", but BP still low. Notified physician Alain Marion, orders received. Deanna Artis, RN

## 2019-12-29 DIAGNOSIS — I10 Essential (primary) hypertension: Secondary | ICD-10-CM | POA: Diagnosis present

## 2019-12-29 DIAGNOSIS — Z8547 Personal history of malignant neoplasm of testis: Secondary | ICD-10-CM | POA: Diagnosis not present

## 2019-12-29 DIAGNOSIS — M1712 Unilateral primary osteoarthritis, left knee: Secondary | ICD-10-CM | POA: Diagnosis present

## 2019-12-29 DIAGNOSIS — E785 Hyperlipidemia, unspecified: Secondary | ICD-10-CM | POA: Diagnosis present

## 2019-12-29 DIAGNOSIS — Z96641 Presence of right artificial hip joint: Secondary | ICD-10-CM | POA: Diagnosis present

## 2019-12-29 NOTE — Progress Notes (Signed)
Pt and pt's Wife provided with d/c paperwork. After discussing the pt's plan of care upon d/c home, the pt denied any further questions or concerns.

## 2019-12-29 NOTE — Progress Notes (Signed)
Physical Therapy Treatment Patient Details Name: Joshua [name] MRN: [id number] DOB: 08-28-1945 Today's Date: 12/29/2019    History of Present Illness Patient is 74 y.o. s/p L TKA . PMH: R DA THA6/21, significant for HTN, GERD, OA, testicular cancer.    PT Comments    Pt ambulated in hallway and practiced safe stair technique.  Pt also performed LE exercises.  Pt provided with HEP and stair handouts.  Pt feels ready for d/c home.     Follow Up Recommendations  Follow surgeon's recommendation for DC plan and follow-up therapies     Equipment Recommendations  None recommended by PT    Recommendations for Other Services       Precautions / Restrictions Precautions Precautions: Fall;Knee Restrictions Weight Bearing Restrictions: Yes LLE Weight Bearing: Weight bearing as tolerated Other Position/Activity Restrictions: WBAT    Mobility  Bed Mobility Overal bed mobility: Needs Assistance Bed Mobility: Sit to Supine     Supine to sit: Supervision     General bed mobility comments: for safety  Transfers Overall transfer level: Needs assistance Equipment used: Rolling walker (2 wheeled) Transfers: Sit to/from Stand Sit to Stand: Min guard         General transfer comment: min/guard for safety, pt able to recall hand placement, cue for L LE forward for pain control  Ambulation/Gait Ambulation/Gait assistance: Min guard Gait Distance (Feet): 120 Feet Assistive device: Rolling walker (2 wheeled) Gait Pattern/deviations: Step-to pattern;Antalgic;Decreased stance time - left Gait velocity: decreased   General Gait Details: verbal cues for sequence, RW positioning, posture   Stairs Stairs: Yes Stairs assistance: Min guard Stair Management: Step to pattern;Backwards;With walker Number of Stairs: 2 General stair comments: verbal cues for sequence, RW positioning, safety; performed once and pt felt comfortable with this performance and declined again; provided  handout for spouse to review (not present)   Wheelchair Mobility    Modified Rankin (Stroke Patients Only)       Balance Overall balance assessment: Needs assistance Sitting-balance support: Feet supported Sitting balance-Leahy Scale: Good     Standing balance support: Bilateral upper extremity supported Standing balance-Leahy Scale: Poor                              Cognition Arousal/Alertness: Awake/alert Behavior During Therapy: WFL for tasks assessed/performed Overall Cognitive Status: Within Functional Limits for tasks assessed                                        Exercises Total Joint Exercises Ankle Circles/Pumps: AROM;20 reps;Both;Seated Quad Sets: AROM;10 reps;Left Heel Slides: AAROM;Left;10 reps;Seated Hip ABduction/ADduction: AROM;Left;10 reps Straight Leg Raises: AAROM;Left;10 reps    General Comments General comments (skin integrity, edema, etc.): BP semi-supine 145/73, EOB BP 154/79 denied dizziness throughout session      Pertinent Vitals/Pain Pain Assessment: 0-10 Pain Score: 4  Faces Pain Scale: Hurts a little bit Pain Location: left knee Pain Descriptors / Indicators: Discomfort;Aching Pain Intervention(s): Repositioned;Monitored during session;Premedicated before session    Home Living                      Prior Function            PT Goals (current goals can now be found in the care plan section) Acute Rehab PT Goals Patient Stated Goal: get home and recover and return  to independence Progress towards PT goals: Progressing toward goals    Frequency    7X/week      PT Plan Current plan remains appropriate    Co-evaluation              AM-PAC PT "6 Clicks" Mobility   Outcome Measure  Help needed turning from your back to your side while in a flat bed without using bedrails?: A Little Help needed moving from lying on your back to sitting on the side of a flat bed without using  bedrails?: A Little Help needed moving to and from a bed to a chair (including a wheelchair)?: A Little Help needed standing up from a chair using your arms (e.g., wheelchair or bedside chair)?: A Little Help needed to walk in hospital room?: A Little Help needed climbing 3-5 steps with a railing? : A Little 6 Click Score: 18    End of Session Equipment Utilized During Treatment: Gait belt Activity Tolerance: Patient tolerated treatment well Patient left: in chair;with call bell/phone within reach Nurse Communication: Mobility status PT Visit Diagnosis: Other abnormalities of gait and mobility (R26.89)     Time: 1216-2446 PT Time Calculation (min) (ACUTE ONLY): 23 min  Charges:  $Gait Training: 8-22 mins $Therapeutic Exercise: 8-22 mins                    Jannette Spanner PT, DPT Acute Rehabilitation Services Pager: [phone] Office: [phone]  York Ram E 12/29/2019, 12:44 PM

## 2019-12-29 NOTE — Progress Notes (Signed)
    Subjective: Patient reports pain as mild.  Tolerating diet.  Urinating.  +Flatus.  No CP, SOB.  Paraesthesia  OOB with PT States that he had issues with PT yesterday due to timing of med medication. Pain is much better this morning and he feels like as long has he has pain medications prior to therapy he will be able to do well.   Objective:   VITALS:   Vitals:   12/28/19 1200 12/28/19 1335 12/28/19 2134 12/29/19 0530  BP: 131/76 136/72 (!) 150/81 (!) 147/81  Pulse:  68 91 81  Resp:  17 17 18   Temp:  98.1 F (36.7 C) 98.6 F (37 C) 98.3 F (36.8 C)  TempSrc:  Oral Oral Oral  SpO2:  98% 96% 98%  Weight:      Height:       CBC Latest Ref Rng & Units 12/19/2019 10/26/2019 11/08/2009  WBC 4.0 - 10.5 K/uL 6.1 6.6 7.3  Hemoglobin 13.0 - 17.0 g/dL 14.1 14.6 14.0  Hematocrit 39 - 52 % 41.2 43.5 39.8  Platelets 150 - 400 K/uL 173 157 151   BMP Latest Ref Rng & Units 12/19/2019 10/26/2019 11/08/2009  Glucose 70 - 99 mg/dL 102(H) 99 96  BUN 8 - 23 mg/dL 15 18 17   Creatinine 0.61 - 1.24 mg/dL 0.80 0.94 0.8  Sodium 135 - 145 mmol/L 141 136 143  Potassium 3.5 - 5.1 mmol/L 4.3 4.4 3.5  Chloride 98 - 111 mmol/L 103 100 105  CO2 22 - 32 mmol/L 28 26 26   Calcium 8.9 - 10.3 mg/dL 9.6 9.3 9.3   Intake/Output      08/04 0701 - 08/05 0700   P.O. 360   I.V. (mL/kg) 0 (0)   IV Piggyback 1209.4   Total Intake(mL/kg) 1569.4 (13.6)   Urine (mL/kg/hr) 1100 (0.4)   Stool 0   Total Output 1100   Net +469.4       Stool Occurrence 0 x      Physical Exam: General: NAD.  Resting comfortable in bed Resp: No increased wob Cardio: regular rate and rhythm ABD soft Neurologically intact MSK Neurovascularly intact Sensation intact distally Intact pulses distally Dorsiflexion/Plantar flexion intact Incision: dressing C/D/I Neurologically intact Dorsiflexion/Plantar flexion intact No cellulitis present Compartment soft  Assessment: 2 Days Post-Op  S/P Procedure(s) (LRB): TOTAL KNEE  ARTHROPLASTY (Left) by Dr. Ernesta Amble. Percell Miller on 12/27/2019  Principal Problem:   Primary osteoarthritis of left knee Active Problems:   History of bleeding ulcers   History of testicular cancer   HTN (hypertension)   HLD (hyperlipidemia)   History of total hip arthroplasty, right   S/P left knee surgery  ADDITIONAL DIAGNOSIS:     Plan:  Discharge home with home health Incentive Spirometry Elevate and Apply ice   Weightbearing: WBAT LLE Insicional and dressing care: Dressings left intact until follow-up Orthopedic device(s): None Showering: Keep dressing dry VTE prophylaxis: Aspirin 81mg  BID 30 days, SCDs, ambulation Pain control: Norco, Tylenol, Celebrex Follow - up plan: 2 weeks Contact information:  Edmonia Lynch MD, Roxan Hockey PA-C  Dispo: [place], [place] 12/29/2019, 6:37 AM

## 2019-12-29 NOTE — Progress Notes (Signed)
Occupational Therapy Progress Note  Patient reports less pain today, scheduled pain medications prior to therapy. Reports 2/10 while standing sink side with grooming/hygiene at supervision level. Patient supervision for supine to sit, min guard for sit to stand, ambulation with walker to sink then to recliner with min cues for body mechanics. Patient reports feeling ready to go home today, progressing well with acute goals.    12/29/19 1200  OT Visit Information  Last OT Received On 12/29/19  Assistance Needed +1  History of Present Illness Patient is 74 y.o. s/p L TKA . PMH: R DA THA6/21, significant for HTN, GERD, OA, testicular cancer.  Precautions  Precautions Fall;Knee  Pain Assessment  Pain Assessment 0-10  Faces Pain Scale 2  Pain Location left knee  Pain Descriptors / Indicators Discomfort;Aching  Pain Intervention(s) Monitored during session;Premedicated before session  Cognition  Arousal/Alertness Awake/alert  Behavior During Therapy WFL for tasks assessed/performed  Overall Cognitive Status Within Functional Limits for tasks assessed  Upper Extremity Assessment  Upper Extremity Assessment Overall WFL for tasks assessed  ADL  Overall ADL's  Needs assistance/impaired  Grooming Oral care;Wash/dry hands;Supervision/safety;Standing  Toilet Transfer Min guard;Ambulation;Cueing for sequencing;BSC;RW  Toilet Transfer Details (indicate cue type and reason) from sink to recliner, min G for safety   Functional mobility during ADLs Min guard;Cueing for sequencing;Rolling walker  Bed Mobility  Overal bed mobility Needs Assistance  Bed Mobility Sit to Supine  Supine to sit Supervision  General bed mobility comments for safety  Balance  Overall balance assessment Needs assistance  Sitting-balance support Feet supported  Sitting balance-Leahy Scale Good  Standing balance support Bilateral upper extremity supported  Standing balance-Leahy Scale Poor  Restrictions  Weight Bearing  Restrictions Yes  LLE Weight Bearing WBAT  Transfers  Overall transfer level Needs assistance  Equipment used Rolling walker (2 wheeled)  Transfers Sit to/from Stand  Sit to Stand Min guard  General transfer comment min guard for safety, cue to extend L LE when sitting into recliner  General Comments  General comments (skin integrity, edema, etc.) BP semi-supine 145/73, EOB BP 154/79 denied dizziness throughout session  OT - End of Session  Equipment Utilized During Treatment Rolling walker  Activity Tolerance Patient tolerated treatment well  Patient left in chair;with call bell/phone within reach;with chair alarm set  Nurse Communication Mobility status  OT Assessment/Plan  OT Plan Discharge plan remains appropriate  OT Visit Diagnosis Pain;Other abnormalities of gait and mobility (R26.89)  Pain - Right/Left Left  Pain - part of body Knee  OT Frequency (ACUTE ONLY) Min 2X/week  Follow Up Recommendations Follow surgeon's recommendation for DC plan and follow-up therapies  OT Equipment Other (comment);Toilet riser (reacher and sock aid)  AM-PAC OT "6 Clicks" Daily Activity Outcome Measure (Version 2)  Help from another person eating meals? 4  Help from another person taking care of personal grooming? 3  Help from another person toileting, which includes using toliet, bedpan, or urinal? 3  Help from another person bathing (including washing, rinsing, drying)? 3  Help from another person to put on and taking off regular upper body clothing? 3  Help from another person to put on and taking off regular lower body clothing? 3  6 Click Score 19  OT Goal Progression  Progress towards OT goals Progressing toward goals  Acute Rehab OT Goals  Patient Stated Goal get home and recover and return to independence  OT Goal Formulation With patient  Time For Goal Achievement 01/11/20  Potential to Achieve  Goals Good  ADL Goals  Pt Will Perform Grooming with supervision;sitting;standing  Pt  Will Perform Lower Body Dressing with supervision;with adaptive equipment;sit to/from stand;sitting/lateral leans  Pt Will Transfer to Toilet with supervision;ambulating (walker)  Pt Will Perform Toileting - Clothing Manipulation and hygiene sitting/lateral leans;sit to/from stand;with supervision  OT Time Calculation  OT Start Time (ACUTE ONLY) 0824  OT Stop Time (ACUTE ONLY) 0854  OT Time Calculation (min) 30 min  OT General Charges  $OT Visit 1 Visit  OT Treatments  $Self Care/Home Management  23-37 mins   Delbert Phenix OT OT pager: [phone]

## 2020-05-14 ENCOUNTER — Other Ambulatory Visit: Payer: Self-pay | Admitting: Urology

## 2020-05-14 DIAGNOSIS — C61 Malignant neoplasm of prostate: Secondary | ICD-10-CM

## 2020-05-21 ENCOUNTER — Other Ambulatory Visit: Payer: Self-pay

## 2020-05-21 ENCOUNTER — Encounter (HOSPITAL_COMMUNITY)
Admission: RE | Admit: 2020-05-21 | Discharge: 2020-05-21 | Disposition: A | Discharge status: Home / Self Care | Payer: Medicare Other | Source: Ambulatory Visit | Attending: Urology | Admitting: Urology

## 2020-05-21 DIAGNOSIS — C61 Malignant neoplasm of prostate: Secondary | ICD-10-CM | POA: Diagnosis present

## 2020-05-21 MED ORDER — TECHNETIUM TC 99M MEDRONATE IV KIT
21.6000 | PACK | Freq: Once | INTRAVENOUS | Status: AC | PRN
  Administered 2020-05-21: 12:00:00 21.6 via INTRAVENOUS

## 2020-06-12 ENCOUNTER — Ambulatory Visit: Payer: Medicare Other

## 2020-06-12 ENCOUNTER — Ambulatory Visit
Admission: RE | Admit: 2020-06-12 | Discharge: 2020-06-12 | Disposition: A | Discharge status: Home / Self Care | Payer: Medicare Other | Source: Ambulatory Visit | Attending: Radiation Oncology | Admitting: Radiation Oncology

## 2020-06-12 NOTE — Progress Notes (Signed)
GU Location of Tumor / Histology: prostatic adenocarcinoma  If Prostate Cancer, Gleason Score is (4 + 3) and PSA is (*6.06). Prostate volume: 39.62 grams.  Joshua [name] has been exposed to agent orange thus gets routine PSA checks. Unfortunately, his PSA began to rise prompting a prostate biopsy.   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  Biopsies of prostate (if applicable) revealed:   Past/Anticipated interventions by urology, if any: orchiectomy, routine PSA checks, prostate biopsy, CT abd/pelvis, bone scan (negative), referral for consideration of brachytherapy  Past/Anticipated interventions by medical oncology, if any: no  Weight changes, if any: no  Bowel/Bladder complaints, if any:    Nausea/Vomiting, if any: no  Pain issues, if any:    SAFETY ISSUES: Prior radiation? Yes, retroperitoneal radiation following orchiectomy Pacemaker/ICD?  Possible current pregnancy? no, male patient Is the patient on methotrexate?   Current Complaints / other details:  75 year old male.

## 2020-07-10 ENCOUNTER — Encounter: Payer: Self-pay | Admitting: Radiation Oncology

## 2020-07-10 ENCOUNTER — Other Ambulatory Visit: Payer: Self-pay

## 2020-07-10 ENCOUNTER — Encounter: Payer: Self-pay | Admitting: Medical Oncology

## 2020-07-10 ENCOUNTER — Ambulatory Visit
Admission: RE | Admit: 2020-07-10 | Discharge: 2020-07-10 | Disposition: A | Discharge status: Home / Self Care | Payer: Medicare Other | Source: Ambulatory Visit | Attending: Radiation Oncology | Admitting: Radiation Oncology

## 2020-07-10 VITALS — BP 128/76 | HR 65 | Temp 97.6°F | Resp 20 | Ht 70.0 in | Wt 267.2 lb

## 2020-07-10 DIAGNOSIS — M069 Rheumatoid arthritis, unspecified: Secondary | ICD-10-CM | POA: Insufficient documentation

## 2020-07-10 DIAGNOSIS — K219 Gastro-esophageal reflux disease without esophagitis: Secondary | ICD-10-CM | POA: Diagnosis not present

## 2020-07-10 DIAGNOSIS — I10 Essential (primary) hypertension: Secondary | ICD-10-CM | POA: Insufficient documentation

## 2020-07-10 DIAGNOSIS — E039 Hypothyroidism, unspecified: Secondary | ICD-10-CM | POA: Insufficient documentation

## 2020-07-10 DIAGNOSIS — M129 Arthropathy, unspecified: Secondary | ICD-10-CM | POA: Insufficient documentation

## 2020-07-10 DIAGNOSIS — C61 Malignant neoplasm of prostate: Secondary | ICD-10-CM | POA: Insufficient documentation

## 2020-07-10 DIAGNOSIS — Z79899 Other long term (current) drug therapy: Secondary | ICD-10-CM | POA: Diagnosis not present

## 2020-07-10 DIAGNOSIS — H908 Mixed conductive and sensorineural hearing loss, unspecified: Secondary | ICD-10-CM | POA: Insufficient documentation

## 2020-07-10 DIAGNOSIS — Z77121 Contact with and (suspected) exposure to harmful algae and algae toxins: Secondary | ICD-10-CM | POA: Insufficient documentation

## 2020-07-10 DIAGNOSIS — F419 Anxiety disorder, unspecified: Secondary | ICD-10-CM | POA: Insufficient documentation

## 2020-07-10 DIAGNOSIS — Z7729 Contact with and (suspected ) exposure to other hazardous substances: Secondary | ICD-10-CM | POA: Diagnosis not present

## 2020-07-10 DIAGNOSIS — Z8547 Personal history of malignant neoplasm of testis: Secondary | ICD-10-CM | POA: Diagnosis not present

## 2020-07-10 DIAGNOSIS — Z923 Personal history of irradiation: Secondary | ICD-10-CM | POA: Diagnosis not present

## 2020-07-10 DIAGNOSIS — Z9189 Other specified personal risk factors, not elsewhere classified: Secondary | ICD-10-CM | POA: Insufficient documentation

## 2020-07-10 DIAGNOSIS — M25551 Pain in right hip: Secondary | ICD-10-CM | POA: Insufficient documentation

## 2020-07-10 DIAGNOSIS — K922 Gastrointestinal hemorrhage, unspecified: Secondary | ICD-10-CM | POA: Insufficient documentation

## 2020-07-10 DIAGNOSIS — N529 Male erectile dysfunction, unspecified: Secondary | ICD-10-CM | POA: Insufficient documentation

## 2020-07-10 DIAGNOSIS — H8110 Benign paroxysmal vertigo, unspecified ear: Secondary | ICD-10-CM | POA: Insufficient documentation

## 2020-07-10 DIAGNOSIS — M25519 Pain in unspecified shoulder: Secondary | ICD-10-CM | POA: Insufficient documentation

## 2020-07-10 DIAGNOSIS — C6290 Malignant neoplasm of unspecified testis, unspecified whether descended or undescended: Secondary | ICD-10-CM | POA: Insufficient documentation

## 2020-07-10 DIAGNOSIS — D649 Anemia, unspecified: Secondary | ICD-10-CM | POA: Insufficient documentation

## 2020-07-10 HISTORY — DX: Malignant neoplasm of prostate: C61

## 2020-07-10 NOTE — Progress Notes (Signed)
Introduced myself to patient and his wife as the prostate nurse navigator and discussed my role. No barriers to care identified at this time. He is not interested in surgery but here to discuss his radiation treatment options. I have him my business card and asked them to call me with questions or concerns. He voiced understanding.

## 2020-07-10 NOTE — Progress Notes (Signed)
GU Location of Tumor / Histology: prostatic adenocarcinoma  If Prostate Cancer, Gleason Score is (4 + 3) and PSA is (*6.06). Prostate volume: 39.62 grams.  Joshua [name] has been exposed to agent orange thus gets routine PSA checks. Unfortunately, his PSA began to rise prompting a prostate biopsy.   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  Biopsies of prostate (if applicable) revealed:   Past/Anticipated interventions by urology, if any: orchiectomy, routine PSA checks, prostate biopsy, CT abd/pelvis, bone scan (negative), referral for consideration of brachytherapy  Past/Anticipated interventions by medical oncology, if any: no  Weight changes, if any: no  Bowel/Bladder complaints, if any:  IPSS 19. SHIM 6. Denies dysuria or hematuria. Denies urinary leakage or incontinence. Denies diarrhea.  Nausea/Vomiting, if any: no  Pain issues, if any:  Denies new pain.   SAFETY ISSUES:  Prior radiation? Yes, retroperitoneal radiation following orchiectomy  Pacemaker/ICD? denies  Possible current pregnancy? no, male patient  Is the patient on methotrexate? denies  75 year old male. Married with four grown children. Exposed to agent orange. Hx of testicular cancer.

## 2020-07-10 NOTE — Progress Notes (Signed)
Radiation Oncology         [phone]) [phone] ________________________________  Initial outpatient Consultation  Name: Joshua [name] MRN: [id number]  Date: 07/10/2020  DOB: Nov 23, 1945  OE:[name], [name], MD  Franchot Gallo, MD   REFERRING PHYSICIAN: Franchot Gallo, MD  DIAGNOSIS: 75 y.o. gentleman with Stage T1c adenocarcinoma of the prostate with Gleason score of 4+3, and PSA of 6.06.    ICD-10-CM   1. Malignant neoplasm of prostate ([place])  [facility] referral to Social Work  2. Primary malignant neoplasm of prostate ([place])  C61     HISTORY OF PRESENT ILLNESS: Joshua [name] is a 75 y.o. male with a diagnosis of prostate cancer. His PSA has been monitored for years given a history of Agent Orange exposure.  He was noted to have an elevated PSA of 7.98 by his primary care physician, Dr. Shirleen Schirmer with the [facility]. This was up from 3.4 in 06/2017 and 2.67 in 07/2016. Accordingly, he was referred for evaluation in urology by Dr. Diona Fanti on 08/19/19,  digital rectal examination was performed at that time revealing no nodules. A repeat PSA on 09/23/19 had decreased slightly but remained elevated at 6.06. The patient proceeded to transrectal ultrasound with 12 biopsies of the prostate on 03/22/20. This was delayed as a result of right hip arthroplasty in 10/2019 and left knee arthroplasty in 12/2019 which were also delayed as a result of the COVID-19 pandemic. The prostate volume measured 39.62 cc.  Out of 12 core biopsies, 3 were positive.  The maximum Gleason score was 4+3, and this was seen in the left mid lateral, left apex lateral (small focus), and left apex (with ductal features).  He underwent bone scan on 05/21/2020 showing no evidence of bony metastatic disease. He also underwent CT A/P on 06/04/20, which was also negative for metastatic disease.  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 Diona Fanti) and adjuvant radiation  therapy.  The patient reviewed the biopsy results with his urologist and he has kindly been referred today for discussion of potential radiation treatment options.   PREVIOUS RADIATION THERAPY: Yes Approximately 1990: Adjuvant Retroperitoneal XRT, following left orchiectomy for testicular seminoma  PAST MEDICAL HISTORY:  Past Medical History:  Diagnosis Date  . Arthritis   . Cancer [facility])    Testicular   . GERD (gastroesophageal reflux disease)   . 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 Hypertension   . Prostate cancer ([place])       PAST SURGICAL HISTORY: Past Surgical History:  Procedure Laterality Date  . COLONOSCOPY    . INJECTION KNEE Left 11/01/2019   Procedure: KNEE INJECTION;  Surgeon: Renette Butters, MD;  Location: [facility];  Service: Orthopedics;  Laterality: Left;  . SURGERY SCROTAL / TESTICULAR    . TONSILLECTOMY    . TOTAL HIP ARTHROPLASTY Right 11/01/2019   Procedure: TOTAL HIP ARTHROPLASTY ANTERIOR APPROACH;  Surgeon: Renette Butters, MD;  Location: [facility];  Service: Orthopedics;  Laterality: Right;  . TOTAL KNEE ARTHROPLASTY Left 12/27/2019   Procedure: TOTAL KNEE ARTHROPLASTY;  Surgeon: Renette Butters, MD;  Location: [facility];  Service: Orthopedics;  Laterality: Left;    FAMILY HISTORY:  Family History  Problem Relation Age of Onset  . Breast cancer Mother 23  . Prostate cancer Neg Hx   . Pancreatic cancer Neg Hx   . Colon cancer Neg Hx     SOCIAL HISTORY:  Social History  Socioeconomic History  . Marital status: Married    Spouse name: Not on file  . Number of children: 4  . Years of education: Not on file  . Highest education level: Not on file  Occupational History  . Not on file  Tobacco Use  . Smoking status: Never Smoker  . Smokeless tobacco: Never Used  Vaping Use  . Vaping Use: Never used  Substance and Sexual Activity  . Alcohol use: Yes    Alcohol/week: 1.0 standard drink    Types: 1 Glasses of wine per week     Comment: week  . Drug use: Never  . Sexual activity: Not Currently  Other Topics Concern  . Not on file  Social History Narrative   Resides /[place] area   Social Determinants of Health   Financial Resource Strain: Not on file  Food Insecurity: Not on file  Transportation Needs: Not on file  Physical Activity: Not on file  Stress: Not on file  Social Connections: Not on file  Intimate Partner Violence: Not on file    ALLERGIES: Patient has no known allergies.  MEDICATIONS:  Current Outpatient Medications  Medication Sig Dispense Refill  . amLODipine (NORVASC) 10 MG tablet TAKE ONE TABLET BY MOUTH DAILY FOR HEART    . atorvastatin (LIPITOR) 20 MG tablet TAKE ONE TABLET BY MOUTH AT BEDTIME FOR CHOLESTEROL    . cyanocobalamin 100 MCG tablet TAKE 2500MG BY MOUTH DAILY    . diclofenac Sodium (VOLTAREN) 1 % GEL APPLY 4 GRAMS TO AFFECTED AREA DAILY AS NEEDED    . hydrochlorothiazide (HYDRODIURIL) 25 MG tablet Take 1 tablet by mouth daily.    Marland Kitchen levothyroxine (SYNTHROID) 112 MCG tablet Take 1 tablet by mouth daily.    Marland Kitchen losartan (COZAAR) 100 MG tablet TAKE ONE-HALF TABLET BY MOUTH DAILY FOR HEART    . Metoprolol Succinate 100 MG CS24 TAKE ONE-HALF TABLET BY MOUTH DAILY - NOTE DIFFERENT DIRECTIONS    . pyridoxine (B-6) 100 MG tablet Take 1 tablet by mouth daily.    Marland Kitchen acetaminophen (TYLENOL) 325 MG tablet Take 1-2 tablets (325-650 mg total) by mouth every 6 (six) hours as needed for mild pain (pain score 1-3 or temp > 100.5). (Patient not taking: Reported on 07/10/2020) 90 tablet 0   No current facility-administered medications for this encounter.    REVIEW OF SYSTEMS:  On review of systems, the patient reports that he is doing well overall. He denies any chest pain, shortness of breath, cough, fevers, chills, night sweats, unintended weight changes. He denies any bowel disturbances, and denies abdominal pain, nausea or vomiting. He denies any new musculoskeletal or joint aches  or pains. His IPSS was 19, indicating moderate urinary symptoms. His SHIM was 6, indicating he has severe erectile dysfunction. A complete review of systems is obtained and is otherwise negative.    PHYSICAL EXAM:  Wt Readings from Last 3 Encounters:  07/10/20 267 lb 3.2 oz (121.2 kg)  12/27/19 255 lb 0.1 oz (115.7 kg)  12/19/19 (!) 255 lb (115.7 kg)   Temp Readings from Last 3 Encounters:  07/10/20 97.6 F (36.4 C)  12/29/19 98.3 F (36.8 C) (Oral)  12/19/19 98.6 F (37 C) (Oral)   BP Readings from Last 3 Encounters:  07/10/20 128/76  12/29/19 (!) 147/81  12/19/19 (!) 138/67   Pulse Readings from Last 3 Encounters:  07/10/20 65  12/29/19 81  12/19/19 73   Pain Assessment Pain Score: 0-No pain/10  In general this is a well  appearing Caucasian male in no acute distress. He's alert and oriented x4 and appropriate throughout the examination. Cardiopulmonary assessment is negative for acute distress, and he exhibits normal effort.     KPS = 100  100 - Normal; no complaints; no evidence of disease. 90   - Able to carry on normal activity; minor signs or symptoms of disease. 80   - Normal activity with effort; some signs or symptoms of disease. 17   - Cares for self; unable to carry on normal activity or to do active work. 60   - Requires occasional assistance, but is able to care for most of his personal needs. 50   - Requires considerable assistance and frequent medical care. 33   - Disabled; requires special care and assistance. 80   - Severely disabled; hospital admission is indicated although death not imminent. 54   - Very sick; hospital admission necessary; active supportive treatment necessary. 10   - Moribund; fatal processes progressing rapidly. 0     - Dead  Karnofsky DA, Abelmann [place], Craver LS and Burchenal [facility] [phone]) The use of the nitrogen mustards in the palliative treatment of carcinoma: with particular reference to bronchogenic carcinoma Cancer 1  634-56  LABORATORY DATA:  Lab Results  Component Value Date   WBC 6.1 12/19/2019   HGB 14.1 12/19/2019   HCT 41.2 12/19/2019   MCV 90.9 12/19/2019   PLT 173 12/19/2019   Lab Results  Component Value Date   NA 141 12/19/2019   K 4.3 12/19/2019   CL 103 12/19/2019   CO2 28 12/19/2019   Lab Results  Component Value Date   ALT 29 12/19/2019   AST 25 12/19/2019   ALKPHOS 65 12/19/2019   BILITOT 0.7 12/19/2019     RADIOGRAPHY: No results found.    IMPRESSION/PLAN: 1. 75 y.o. gentleman with Stage T1c adenocarcinoma of the prostate with Gleason Score of 4+3, and PSA of 6.06. We discussed the patient's workup and outlined the nature of prostate cancer in this setting. The patient's T stage, Gleason's score, and PSA put him into the intermediate risk group. Accordingly, he is eligible for a variety of potential treatment options including brachytherapy, 5.5 weeks of external radiation, or prostatectomy. We discussed the available radiation techniques, and focused on the details and logistics of delivery. We discussed and outlined the risks, benefits, short and long-term effects associated with radiotherapy and compared and contrasted these with prostatectomy. We discussed the role of SpaceOAR gel in reducing the rectal toxicity associated with radiotherapy. He appears to have a good understanding of his disease and our treatment recommendations which are of curative intent.  He was encouraged to ask questions that were answered to his stated satisfaction.  At the conclusion of our conversation, the patient is interested in moving forward with brachytherapy and use of SpaceOAR gel to reduce rectal toxicity from radiotherapy.  We will share our discussion with Dr. Diona Fanti and move forward with scheduling his CT [facility] planning appointment in the near future.  The patient met briefly with Romie Jumper in our office who will be working closely with him to coordinate OR scheduling and pre and post  procedure appointments.  We will contact the pharmaceutical rep to ensure that [place] is available at the time of procedure.  We enjoyed meeting him today and look forward to continuing to participate in his care.    Nicholos Johns, PA-C    Tyler Pita, MD  [facility]  Radiation Oncology Direct Dial:  [phone]  Fax: [phone] [email or web address]  Skype  [email or web address]   This document serves as a record of services personally performed by Tyler Pita, MD and Freeman Caldron, PA-C. It was created on their behalf by Wilburn Mylar, a trained medical scribe. The creation of this record is based on the scribe's personal observations and the provider's statements to them. This document has been checked and approved by the attending provider.

## 2020-07-11 ENCOUNTER — Encounter: Payer: Self-pay | Admitting: Licensed Clinical Social Worker

## 2020-07-11 NOTE — Progress Notes (Signed)
[place] Psychosocial Distress Screening Clinical Social Work  Clinical Social Work was referred by distress screening protocol.  The patient scored a 6 on the Psychosocial Distress Thermometer which indicates moderate distress. Clinical Social Worker attempted to contact patient by phone to assess for distress and other psychosocial needs.  No answer. Left VM with direct contact information.  ONCBCN DISTRESS SCREENING 07/10/2020  Screening Type Initial Screening  Distress experienced in past week (1-10) 6  Emotional problem type Nervousness/Anxiety;Adjusting to illness  Spiritual/Religous concerns type Facing my mortality  Information Concerns Type Lack of info about treatment  Physical Problem type Pain;Changes in urination;Tingling hands/feet;Swollen arms/legs  Physician notified of physical symptoms Yes  Referral to clinical psychology No  Referral to clinical social work No  Referral to dietition No  Referral to financial advocate No  Referral to support programs Yes  Referral to palliative care No      Joshua Situ E Morad Tal, LCSW

## 2020-07-13 ENCOUNTER — Telehealth: Payer: Self-pay | Admitting: *Deleted

## 2020-07-13 NOTE — Telephone Encounter (Signed)
Called patient to ask questions, spoke with patient's wife

## 2020-07-17 ENCOUNTER — Telehealth: Payer: Self-pay | Admitting: *Deleted

## 2020-07-17 NOTE — Telephone Encounter (Signed)
CALLED PATIENT TO INFORM OF PRE-SEED APPTS. FOR 08-02-20, SPOKE WITH PATIENT AND HE IS AWARE OF THESE APPTS.

## 2020-07-26 ENCOUNTER — Other Ambulatory Visit: Payer: Self-pay | Admitting: Urology

## 2020-07-26 ENCOUNTER — Telehealth: Payer: Self-pay | Admitting: *Deleted

## 2020-07-26 NOTE — Telephone Encounter (Signed)
Returned patient's phone call, spoke with patient 

## 2020-07-27 ENCOUNTER — Telehealth: Payer: Self-pay | Admitting: Radiation Oncology

## 2020-07-27 NOTE — Telephone Encounter (Signed)
Received a voicemail message from the patient's wife, Eliberto Ivory, requesting a return call. Phoned her back. Answered all of her questions to the best of my ability. Confirmed CT simulation appointment for 0800 on 08/02/20 and follow up with Ashlyn same day at 0830. Gennell verbalized understanding of all reviewed.

## 2020-07-30 ENCOUNTER — Telehealth: Payer: Self-pay | Admitting: *Deleted

## 2020-07-30 NOTE — Telephone Encounter (Signed)
Called patient  to inform of pre-seed appts. for March 10 and his implant for 09-10-20, spoke with patient and he is aware of these appts.

## 2020-08-01 ENCOUNTER — Telehealth: Payer: Self-pay | Admitting: *Deleted

## 2020-08-01 NOTE — Telephone Encounter (Signed)
CALLED PATIENT TO REMIND OF PRE-SEED APPTS. FOR 08-02-20, SPOKE WITH PATIENT AND HE IS AWARE OF THESE APPTS.

## 2020-08-02 ENCOUNTER — Encounter (HOSPITAL_COMMUNITY)
Admission: RE | Admit: 2020-08-02 | Discharge: 2020-08-02 | Disposition: A | Discharge status: Home / Self Care | Payer: Medicare Other | Source: Ambulatory Visit | Attending: Urology | Admitting: Urology

## 2020-08-02 ENCOUNTER — Other Ambulatory Visit: Payer: Self-pay

## 2020-08-02 ENCOUNTER — Ambulatory Visit
Admission: RE | Admit: 2020-08-02 | Discharge: 2020-08-02 | Disposition: A | Discharge status: Home / Self Care | Payer: Medicare Other | Source: Ambulatory Visit | Attending: Radiation Oncology | Admitting: Radiation Oncology

## 2020-08-02 ENCOUNTER — Ambulatory Visit
Admission: RE | Admit: 2020-08-02 | Discharge: 2020-08-02 | Disposition: A | Discharge status: Home / Self Care | Payer: Medicare Other | Source: Ambulatory Visit | Attending: Urology | Admitting: Urology

## 2020-08-02 ENCOUNTER — Ambulatory Visit (HOSPITAL_COMMUNITY)
Admission: RE | Admit: 2020-08-02 | Discharge: 2020-08-02 | Disposition: A | Discharge status: Home / Self Care | Payer: Medicare Other | Source: Ambulatory Visit | Attending: Urology | Admitting: Urology

## 2020-08-02 DIAGNOSIS — Z01818 Encounter for other preprocedural examination: Secondary | ICD-10-CM

## 2020-08-02 DIAGNOSIS — C61 Malignant neoplasm of prostate: Secondary | ICD-10-CM

## 2020-08-02 NOTE — Progress Notes (Signed)
  Radiation Oncology         [phone] ________________________________  Name: NARADA UZZLE MRN: [id number]  Date: 08/02/2020  DOB: [birth year]  SIMULATION AND TREATMENT PLANNING NOTE PUBIC ARCH STUDY  [id number], [name], MD  Franchot Gallo, MD  DIAGNOSIS:  75 y.o. gentleman with Stage T1c adenocarcinoma of the prostate with Gleason score of 4+3, and PSA of 6.06.  Oncology History  Primary malignant neoplasm of prostate ([place])  03/22/2020 Cancer Staging   Staging form: Prostate, AJCC 8th Edition - Clinical stage from 03/22/2020: Stage IIC (cT1c, cN0, cM0, PSA: 6.1, Grade Group: 3) - Signed by Freeman Caldron, PA-C on 07/11/2020 Histopathologic type: Adenocarcinoma, NOS Stage prefix: Initial diagnosis Prostate specific antigen (PSA) range: Less than 10 Gleason primary pattern: 4 Gleason secondary pattern: 3 Gleason score: 7 Histologic grading system: 5 grade system Number of biopsy cores examined: 12 Number of biopsy cores positive: 3 Location of positive needle core biopsies: One side   07/10/2020 Initial Diagnosis   Primary malignant neoplasm of prostate ([place])       ICD-10-CM   1. Primary malignant neoplasm of prostate ([place])  C61     COMPLEX SIMULATION:  The patient presented today for evaluation for possible prostate seed implant. He was brought to the radiation planning suite and placed supine on the CT couch. A 3-dimensional image study set was obtained in upload to the planning computer. There, on each axial slice, I contoured the prostate gland. Then, using three-dimensional radiation planning tools I reconstructed the prostate in view of the structures from the transperineal needle pathway to assess for possible pubic arch interference. In doing so, I did not appreciate any pubic arch interference. Also, the patient's prostate volume was estimated based on the drawn structure. The volume was 41 cc.  Given the pubic arch appearance and prostate volume, patient remains a  good candidate to proceed with prostate seed implant. Today, he freely provided informed written consent to proceed.    PLAN: The patient will undergo prostate seed implant.   ________________________________  Sheral Apley. Tammi Klippel, M.D.

## 2020-08-31 ENCOUNTER — Other Ambulatory Visit: Payer: Self-pay

## 2020-08-31 ENCOUNTER — Encounter (HOSPITAL_BASED_OUTPATIENT_CLINIC_OR_DEPARTMENT_OTHER): Payer: Self-pay | Admitting: Urology

## 2020-08-31 NOTE — Progress Notes (Signed)
Spoke w/ via phone for pre-op interview--- PT Lab needs dos----  no             Lab results------ pt getting CBC< CMP, PT/ PTT done 09-06-2020 @ 0900 COVID test ------ 09-06-2020 @ 1030 Arrive at ------- 0930 on 09-10-2020 NPO after MN NO Solid Food.  Clear liquids from MN until--- 0830 Med rec completed Medications to take morning of surgery ----- Metoprolol, Norvasc, Synthroid Diabetic medication ----- n/a Patient instructed to bring photo id and insurance card day of surgery Patient aware to have Driver (ride ) / caregiver    for 24 hours after surgery -- wife, Gennell Patient Special Instructions ----- will do one fleet enema morning of surgery Pre-Op special Istructions ----- n/a Patient verbalized understanding of instructions that were given at this phone interview. Patient denies shortness of breath, chest pain, fever, cough at this phone interview.

## 2020-09-04 ENCOUNTER — Telehealth: Payer: Self-pay | Admitting: *Deleted

## 2020-09-04 NOTE — Telephone Encounter (Signed)
CALLED PATIENT TO REMIND OF LAB AND COVID TESTING FOR 09-06-20, LVM FOR A RETURN CALL

## 2020-09-06 ENCOUNTER — Encounter (HOSPITAL_COMMUNITY)
Admission: RE | Admit: 2020-09-06 | Discharge: 2020-09-06 | Disposition: A | Discharge status: Home / Self Care | Payer: Medicare Other | Source: Ambulatory Visit | Attending: Urology | Admitting: Urology

## 2020-09-06 ENCOUNTER — Other Ambulatory Visit (HOSPITAL_COMMUNITY)
Admission: RE | Admit: 2020-09-06 | Discharge: 2020-09-06 | Disposition: A | Discharge status: Home / Self Care | Payer: Medicare Other | Source: Ambulatory Visit | Attending: Urology | Admitting: Urology

## 2020-09-06 ENCOUNTER — Other Ambulatory Visit: Payer: Self-pay

## 2020-09-06 DIAGNOSIS — Z01812 Encounter for preprocedural laboratory examination: Secondary | ICD-10-CM | POA: Insufficient documentation

## 2020-09-06 DIAGNOSIS — Z20822 Contact with and (suspected) exposure to covid-19: Secondary | ICD-10-CM | POA: Diagnosis not present

## 2020-09-06 LAB — COMPREHENSIVE METABOLIC PANEL
ALT: 39 U/L (ref 0–44)
AST: 30 U/L (ref 15–41)
Albumin: 4.1 g/dL (ref 3.5–5.0)
Alkaline Phosphatase: 54 U/L (ref 38–126)
Anion gap: 6 (ref 5–15)
BUN: 15 mg/dL (ref 8–23)
CO2: 28 mmol/L (ref 22–32)
Calcium: 9.5 mg/dL (ref 8.9–10.3)
Chloride: 105 mmol/L (ref 98–111)
Creatinine, Ser: 0.87 mg/dL (ref 0.61–1.24)
GFR, Estimated: >60 mL/min (ref >60)
Glucose, Bld: 112 mg/dL — ABNORMAL HIGH (ref 70–99)
Potassium: 4.2 mmol/L (ref 3.5–5.1)
Sodium: 139 mmol/L (ref 135–145)
Total Bilirubin: 0.9 mg/dL (ref 0.3–1.2)
Total Protein: 7.1 g/dL (ref 6.5–8.1)

## 2020-09-06 LAB — CBC
HCT: 40.6 % (ref 39.0–52.0)
Hemoglobin: 14.1 g/dL (ref 13.0–17.0)
MCH: 31.1 pg (ref 26.0–34.0)
MCHC: 34.7 g/dL (ref 30.0–36.0)
MCV: 89.4 fL (ref 80.0–100.0)
Platelets: 160 10*3/uL (ref 150–400)
RBC: 4.54 MIL/uL (ref 4.22–5.81)
RDW: 14 % (ref 11.5–15.5)
WBC: 6.3 10*3/uL (ref 4.0–10.5)
nRBC: 0 % (ref 0.0–0.2)

## 2020-09-06 LAB — SARS CORONAVIRUS 2 (TAT 6-24 HRS): SARS Coronavirus 2: NEGATIVE

## 2020-09-06 LAB — APTT: aPTT: 33 s (ref 24–36)

## 2020-09-06 LAB — PROTIME-INR
INR: 1.1 (ref 0.8–1.2)
Prothrombin Time: 14.1 seconds (ref 11.4–15.2)

## 2020-09-07 ENCOUNTER — Telehealth: Payer: Self-pay | Admitting: *Deleted

## 2020-09-07 NOTE — Telephone Encounter (Signed)
CALLED PATIENT TO REMIND OF PROCEDURE FOR 09-10-20, SPOKE WITH PATIENT'S WIFE- GENNELL AND SHE IS AWARE OF THIS PROCEDURE

## 2020-09-10 ENCOUNTER — Ambulatory Visit (HOSPITAL_BASED_OUTPATIENT_CLINIC_OR_DEPARTMENT_OTHER)
Admission: RE | Admit: 2020-09-10 | Discharge: 2020-09-10 | Disposition: A | Discharge status: Home / Self Care | Payer: Medicare Other | Source: Other Acute Inpatient Hospital | Attending: Urology | Admitting: Urology

## 2020-09-10 ENCOUNTER — Ambulatory Visit (HOSPITAL_BASED_OUTPATIENT_CLINIC_OR_DEPARTMENT_OTHER): Payer: Medicare Other | Admitting: Certified Registered"

## 2020-09-10 ENCOUNTER — Encounter (HOSPITAL_BASED_OUTPATIENT_CLINIC_OR_DEPARTMENT_OTHER)
Admission: RE | Disposition: A | Discharge status: Home / Self Care | Payer: Self-pay | Source: Other Acute Inpatient Hospital | Attending: Urology

## 2020-09-10 ENCOUNTER — Encounter (HOSPITAL_BASED_OUTPATIENT_CLINIC_OR_DEPARTMENT_OTHER): Payer: Self-pay | Admitting: Urology

## 2020-09-10 ENCOUNTER — Ambulatory Visit (HOSPITAL_COMMUNITY): Payer: Medicare Other

## 2020-09-10 ENCOUNTER — Other Ambulatory Visit: Payer: Self-pay

## 2020-09-10 DIAGNOSIS — C61 Malignant neoplasm of prostate: Secondary | ICD-10-CM | POA: Diagnosis present

## 2020-09-10 DIAGNOSIS — Z9079 Acquired absence of other genital organ(s): Secondary | ICD-10-CM | POA: Insufficient documentation

## 2020-09-10 DIAGNOSIS — Z8547 Personal history of malignant neoplasm of testis: Secondary | ICD-10-CM | POA: Insufficient documentation

## 2020-09-10 HISTORY — DX: Benign prostatic hyperplasia with lower urinary tract symptoms: N40.1

## 2020-09-10 HISTORY — DX: Contact with and (suspected) exposure to other hazardous, chiefly nonmedicinal, chemicals: Z77.098

## 2020-09-10 HISTORY — PX: SPACE OAR INSTILLATION: SHX6769

## 2020-09-10 HISTORY — DX: Presence of external hearing-aid: Z97.4

## 2020-09-10 HISTORY — PX: CYSTOSCOPY: SHX5120

## 2020-09-10 HISTORY — DX: Hypothyroidism, unspecified: E03.9

## 2020-09-10 HISTORY — DX: Unspecified osteoarthritis, unspecified site: M19.90

## 2020-09-10 HISTORY — DX: Presence of spectacles and contact lenses: Z97.3

## 2020-09-10 HISTORY — PX: RADIOACTIVE SEED IMPLANT: SHX5150

## 2020-09-10 SURGERY — INSERTION, RADIATION SOURCE, PROSTATE
Anesthesia: General | Site: Rectum

## 2020-09-10 MED ORDER — ONDANSETRON HCL 4 MG/2ML IJ SOLN: 4.0000 mg | Freq: Once | INTRAMUSCULAR | Status: DC | PRN

## 2020-09-10 MED ORDER — FENTANYL CITRATE (PF) 100 MCG/2ML IJ SOLN
INTRAMUSCULAR | Status: AC
  Filled 2020-09-10: qty 2

## 2020-09-10 MED ORDER — ONDANSETRON HCL 4 MG/2ML IJ SOLN
INTRAMUSCULAR | Status: DC | PRN
  Administered 2020-09-10: 4 mg via INTRAVENOUS

## 2020-09-10 MED ORDER — SODIUM CHLORIDE 0.9 % IR SOLN
Status: DC | PRN
  Administered 2020-09-10: 1000 mL via INTRAVESICAL

## 2020-09-10 MED ORDER — FLEET ENEMA 7-19 GM/118ML RE ENEM: 1.0000 | ENEMA | Freq: Once | RECTAL | Status: DC

## 2020-09-10 MED ORDER — ACETAMINOPHEN 10 MG/ML IV SOLN
INTRAVENOUS | Status: AC
  Filled 2020-09-10: qty 100

## 2020-09-10 MED ORDER — IOHEXOL 300 MG/ML  SOLN
INTRAMUSCULAR | Status: DC | PRN
  Administered 2020-09-10: 7 mL

## 2020-09-10 MED ORDER — ONDANSETRON HCL 4 MG/2ML IJ SOLN
INTRAMUSCULAR | Status: AC
  Filled 2020-09-10: qty 2

## 2020-09-10 MED ORDER — GLYCOPYRROLATE PF 0.2 MG/ML IJ SOSY
PREFILLED_SYRINGE | INTRAMUSCULAR | Status: DC | PRN
  Administered 2020-09-10 (×2): .1 mg via INTRAVENOUS

## 2020-09-10 MED ORDER — FENTANYL CITRATE (PF) 100 MCG/2ML IJ SOLN: 25.0000 ug | INTRAMUSCULAR | Status: DC | PRN

## 2020-09-10 MED ORDER — LACTATED RINGERS IV SOLN: INTRAVENOUS | Status: DC

## 2020-09-10 MED ORDER — SODIUM CHLORIDE (PF) 0.9 % IJ SOLN
INTRAMUSCULAR | Status: DC | PRN
  Administered 2020-09-10: 10 mL

## 2020-09-10 MED ORDER — OXYCODONE HCL 5 MG PO TABS: 5.0000 mg | ORAL_TABLET | Freq: Once | ORAL | Status: DC | PRN

## 2020-09-10 MED ORDER — CEFAZOLIN SODIUM-DEXTROSE 2-4 GM/100ML-% IV SOLN
2.0000 g | Freq: Once | INTRAVENOUS | Status: AC
  Administered 2020-09-10: 2 g via INTRAVENOUS

## 2020-09-10 MED ORDER — FENTANYL CITRATE (PF) 100 MCG/2ML IJ SOLN
INTRAMUSCULAR | Status: DC | PRN
  Administered 2020-09-10: 50 ug via INTRAVENOUS
  Administered 2020-09-10: 25 ug via INTRAVENOUS
  Administered 2020-09-10 (×2): 50 ug via INTRAVENOUS
  Administered 2020-09-10: 25 ug via INTRAVENOUS

## 2020-09-10 MED ORDER — MIDAZOLAM HCL 2 MG/2ML IJ SOLN
INTRAMUSCULAR | Status: AC
  Filled 2020-09-10: qty 2

## 2020-09-10 MED ORDER — ACETAMINOPHEN 10 MG/ML IV SOLN
1000.0000 mg | Freq: Once | INTRAVENOUS | Status: DC | PRN
  Administered 2020-09-10: 1000 mg via INTRAVENOUS

## 2020-09-10 MED ORDER — DEXAMETHASONE SODIUM PHOSPHATE 4 MG/ML IJ SOLN
INTRAMUSCULAR | Status: DC | PRN
  Administered 2020-09-10: 5 mg via INTRAVENOUS

## 2020-09-10 MED ORDER — PROPOFOL 10 MG/ML IV BOLUS
INTRAVENOUS | Status: DC | PRN
  Administered 2020-09-10: 200 mg via INTRAVENOUS

## 2020-09-10 MED ORDER — DEXAMETHASONE SODIUM PHOSPHATE 10 MG/ML IJ SOLN
INTRAMUSCULAR | Status: AC
  Filled 2020-09-10: qty 1

## 2020-09-10 MED ORDER — CEFAZOLIN SODIUM-DEXTROSE 2-4 GM/100ML-% IV SOLN
INTRAVENOUS | Status: AC
  Filled 2020-09-10: qty 100

## 2020-09-10 MED ORDER — EPHEDRINE SULFATE-NACL 50-0.9 MG/10ML-% IV SOSY
PREFILLED_SYRINGE | INTRAVENOUS | Status: DC | PRN
  Administered 2020-09-10: 10 mg via INTRAVENOUS

## 2020-09-10 MED ORDER — PROPOFOL 10 MG/ML IV BOLUS
INTRAVENOUS | Status: AC
  Filled 2020-09-10: qty 40

## 2020-09-10 MED ORDER — MIDAZOLAM HCL 2 MG/2ML IJ SOLN
INTRAMUSCULAR | Status: DC | PRN
  Administered 2020-09-10: .5 mg via INTRAVENOUS

## 2020-09-10 MED ORDER — EPHEDRINE 5 MG/ML INJ
INTRAVENOUS | Status: AC
  Filled 2020-09-10: qty 10

## 2020-09-10 MED ORDER — OXYCODONE HCL 5 MG/5ML PO SOLN: 5.0000 mg | Freq: Once | ORAL | Status: DC | PRN

## 2020-09-10 MED ORDER — LIDOCAINE HCL (CARDIAC) PF 100 MG/5ML IV SOSY
PREFILLED_SYRINGE | INTRAVENOUS | Status: DC | PRN
  Administered 2020-09-10: 80 mg via INTRAVENOUS

## 2020-09-10 SURGICAL SUPPLY — 40 items
BAG DRN RND TRDRP ANRFLXCHMBR (UROLOGICAL SUPPLIES) ×3
BAG URINE DRAIN 2000ML AR STRL (UROLOGICAL SUPPLIES) ×4 IMPLANT
BLADE CLIPPER SENSICLIP SURGIC (BLADE) ×4 IMPLANT
CATH FOLEY 2WAY SLVR  5CC 16FR (CATHETERS) ×4
CATH FOLEY 2WAY SLVR 5CC 16FR (CATHETERS) ×3 IMPLANT
CATH ROBINSON RED A/P 16FR (CATHETERS) IMPLANT
CATH ROBINSON RED A/P 20FR (CATHETERS) ×4 IMPLANT
CLOTH BEACON ORANGE TIMEOUT ST (SAFETY) ×4 IMPLANT
CNTNR URN SCR LID CUP LEK RST (MISCELLANEOUS) ×6 IMPLANT
CONT SPEC 4OZ STRL OR WHT (MISCELLANEOUS) ×8
COVER BACK TABLE 60X90IN (DRAPES) ×4 IMPLANT
COVER MAYO STAND STRL (DRAPES) ×4 IMPLANT
DRAPE C-ARM 35X43 STRL (DRAPES) ×4 IMPLANT
DRSG TEGADERM 4X4.75 (GAUZE/BANDAGES/DRESSINGS) ×7 IMPLANT
DRSG TEGADERM 8X12 (GAUZE/BANDAGES/DRESSINGS) ×8 IMPLANT
GAUZE SPONGE 4X4 12PLY STRL (GAUZE/BANDAGES/DRESSINGS) ×1 IMPLANT
GLOVE SURG ENC MOIS LTX SZ6.5 (GLOVE) ×4 IMPLANT
GLOVE SURG ENC MOIS LTX SZ7.5 (GLOVE) IMPLANT
GLOVE SURG ENC MOIS LTX SZ8 (GLOVE) ×8 IMPLANT
GLOVE SURG ORTHO LTX SZ8.5 (GLOVE) ×6 IMPLANT
GLOVE SURG POLYISO LF SZ6.5 (GLOVE) IMPLANT
GLOVE SURG UNDER POLY LF SZ6.5 (GLOVE) ×3 IMPLANT
GLOVE SURG UNDER POLY LF SZ7.5 (GLOVE) ×3 IMPLANT
GOWN STRL REUS W/ TWL LRG LVL3 (GOWN DISPOSABLE) IMPLANT
GOWN STRL REUS W/TWL LRG LVL3 (GOWN DISPOSABLE) ×6 IMPLANT
GOWN STRL REUS W/TWL XL LVL3 (GOWN DISPOSABLE) ×4 IMPLANT
HOLDER FOLEY CATH W/STRAP (MISCELLANEOUS) ×4 IMPLANT
I-SEED AGX100 ×72 IMPLANT
IMPL SPACEOAR VUE SYSTEM (Spacer) IMPLANT
IMPLANT SPACEOAR VUE SYSTEM (Spacer) ×4 IMPLANT
IV NS 1000ML (IV SOLUTION) ×4
IV NS 1000ML BAXH (IV SOLUTION) ×3 IMPLANT
KIT TURNOVER CYSTO (KITS) ×4 IMPLANT
MARKER SKIN DUAL TIP RULER LAB (MISCELLANEOUS) ×4 IMPLANT
PACK CYSTO (CUSTOM PROCEDURE TRAY) ×4 IMPLANT
SUT BONE WAX W31G (SUTURE) IMPLANT
SYR 10ML LL (SYRINGE) ×4 IMPLANT
TOWEL OR 17X26 10 PK STRL BLUE (TOWEL DISPOSABLE) ×4 IMPLANT
UNDERPAD 30X36 HEAVY ABSORB (UNDERPADS AND DIAPERS) ×8 IMPLANT
WATER STERILE IRR 500ML POUR (IV SOLUTION) ×4 IMPLANT

## 2020-09-10 NOTE — Progress Notes (Signed)
No dressing site is clean dry and intact.

## 2020-09-10 NOTE — Transfer of Care (Signed)
Immediate Anesthesia Transfer of Care Note  Patient: Kyson Kupper Mcclurg  Procedure(s) Performed: RADIOACTIVE SEED IMPLANT/BRACHYTHERAPY IMPLANT (N/A Prostate) SPACE OAR INSTILLATION (N/A Rectum) CYSTOSCOPY FLEXIBLE (N/A Bladder)  Patient Location: PACU  Anesthesia Type:General  Level of Consciousness: awake, alert  and oriented  Airway & Oxygen Therapy: Patient Spontanous Breathing and Patient connected to face mask oxygen  Post-op Assessment: Report given to RN and Post -op Vital signs reviewed and stable  Post vital signs: Reviewed and stable  Last Vitals:  Vitals Value Taken Time  BP 128/84   Temp    Pulse 81 09/10/20 1302  Resp 10 09/10/20 1302  SpO2 97 % 09/10/20 1302  Vitals shown include unvalidated device data.  Last Pain:  Vitals:   09/10/20 1000  PainSc: 0-No pain         Complications: No complications documented.

## 2020-09-10 NOTE — Anesthesia Procedure Notes (Signed)
Procedure Name: LMA Insertion Date/Time: 09/10/2020 11:49 AM Performed by: Suan Halter, CRNA Pre-anesthesia Checklist: Patient identified, Emergency Drugs available, Suction available and Patient being monitored Patient Re-evaluated:Patient Re-evaluated prior to induction Oxygen Delivery Method: Circle system utilized Preoxygenation: Pre-oxygenation with 100% oxygen Induction Type: IV induction Ventilation: Mask ventilation without difficulty LMA: LMA inserted LMA Size: 4.0 Number of attempts: 1 Airway Equipment and Method: Bite block Placement Confirmation: positive ETCO2 Tube secured with: Tape Dental Injury: Teeth and Oropharynx as per pre-operative assessment

## 2020-09-10 NOTE — Anesthesia Preprocedure Evaluation (Signed)
Anesthesia Evaluation  Patient identified by MRN, date of birth, ID band Patient awake    Reviewed: Allergy & Precautions, NPO status , Patient's Chart, lab work & pertinent test results, reviewed documented beta blocker date and time   Airway Mallampati: II  TM Distance: >3 FB Neck ROM: Full    Dental  (+) Teeth Intact   Pulmonary neg pulmonary ROS,    Pulmonary exam normal        Cardiovascular hypertension, Pt. on medications and Pt. on home beta blockers  Rhythm:Regular Rate:Normal     Neuro/Psych Anxiety negative neurological ROS     GI/Hepatic negative GI ROS, Neg liver ROS,   Endo/Other  Hypothyroidism   Renal/GU negative Renal ROS   Prostate Ca    Musculoskeletal  (+) Arthritis , Osteoarthritis,    Abdominal (+)  Abdomen: soft. Bowel sounds: normal.  Peds  Hematology  (+) anemia ,   Anesthesia Other Findings   Reproductive/Obstetrics                             Anesthesia Physical Anesthesia Plan  ASA: II  Anesthesia Plan: General   Post-op Pain Management:    Induction: Intravenous  PONV Risk Score and Plan: 2 and Ondansetron, Dexamethasone and Treatment may vary due to age or medical condition  Airway Management Planned: Mask and LMA  Additional Equipment: None  Intra-op Plan:   Post-operative Plan: Extubation in OR  Informed Consent: I have reviewed the patients History and Physical, chart, labs and discussed the procedure including the risks, benefits and alternatives for the proposed anesthesia with the patient or authorized representative who has indicated his/her understanding and acceptance.     Dental advisory given  Plan Discussed with: CRNA  Anesthesia Plan Comments: (Lab Results      Component                Value               Date                      WBC                      6.3                 09/06/2020                HGB                       14.1                09/06/2020                HCT                      40.6                09/06/2020                MCV                      89.4                09/06/2020                PLT  160                 09/06/2020           Lab Results      Component                Value               Date                      NA                       139                 09/06/2020                K                        4.2                 09/06/2020                CO2                      28                  09/06/2020                GLUCOSE                  112 (H)             09/06/2020                BUN                      15                  09/06/2020                CREATININE               0.87                09/06/2020                CALCIUM                  9.5                 09/06/2020                GFRNONAA                 >60                 09/06/2020                GFRAA                    >60                 12/19/2019          )        Anesthesia Quick Evaluation

## 2020-09-10 NOTE — H&P (Signed)
H&P  Chief Complaint: Pca  History of Present Illness: 75 yo male presents for I 125 brachytherapy and SpaceOAR placement for curative mgmt of PCa.  Past Medical History:  Diagnosis Date  . 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 H/O agent Orange exposure   . History of external beam radiation therapy 1994   for testicular cancer  . History of peptic ulcer 2016   w/ upper gi bleed  s/p EGD w/ cauterization and blood transfusion's  . 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 Hypertension    followed by pcp  . Hypothyroidism   . OA (osteoarthritis)   . Prostate cancer [facility]) urologist--- dr Barbee Mamula/  onologist--- dr Tammi Klippel   dx 10/ 2021, Gleason 4+3  . Wears glasses   . Wears hearing aid in both ears     Past Surgical History:  Procedure Laterality Date  . COLONOSCOPY  last one 2020 approx.  . ESOPHAGOGASTRODUODENOSCOPY  2016   per pt had cauterization of bleeding ulcers  . INJECTION KNEE Left 11/01/2019   Procedure: KNEE INJECTION;  Surgeon: Renette Butters, MD;  Location: [facility];  Service: Orthopedics;  Laterality: Left;  . RADICAL ORCHIECTOMY Left 1994  . TONSILLECTOMY  child  . TOTAL HIP ARTHROPLASTY Right 11/01/2019   Procedure: TOTAL HIP ARTHROPLASTY ANTERIOR APPROACH;  Surgeon: Renette Butters, MD;  Location: [facility];  Service: Orthopedics;  Laterality: Right;  . TOTAL KNEE ARTHROPLASTY Left 12/27/2019   Procedure: TOTAL KNEE ARTHROPLASTY;  Surgeon: Renette Butters, MD;  Location: [facility];  Service: Orthopedics;  Laterality: Left;    Home Medications:    Allergies: No Known Allergies  Family History  Problem Relation Age of Onset  . Breast cancer Mother 41  . Prostate cancer Neg Hx   . Pancreatic cancer Neg Hx   . Colon cancer Neg Hx     Social History:  reports that he has never smoked. He has never used smokeless tobacco. He reports current alcohol  use of about 2.0 standard drinks of alcohol per week. He reports that he does not use drugs.  ROS: A complete review of systems was performed.  All systems are negative except for pertinent findings as noted.  Physical Exam:  Vital signs in last 24 hours: Ht 5\' 10"  (1.778 m)   Wt 113.4 kg   BMI 35.87 kg/m  Constitutional:  Alert and oriented, No acute distress Cardiovascular: Regular rate  Respiratory: Normal respiratory effort GI: Abdomen is soft, nontender, nondistended, no abdominal masses. No CVAT.  Genitourinary: Normal male phallus, testes are descended bilaterally and non-tender and without masses, scrotum is normal in appearance without lesions or masses, perineum is normal on inspection. Lymphatic: No lymphadenopathy Neurologic: Grossly intact, no focal deficits Psychiatric: Normal mood and affect  Laboratory Data:  No results for input(s): WBC, HGB, HCT, PLT in the last 72 hours.  No results for input(s): NA, K, CL, GLUCOSE, BUN, CALCIUM, CREATININE in the last 72 hours.  Invalid input(s): CO3   No results found for this or any previous visit (from the past 24 hour(s)). Recent Results (from the past 240 hour(s))  SARS CORONAVIRUS 2 (TAT 6-24 HRS) Nasopharyngeal Nasopharyngeal Swab     Status: None   Collection Time: 09/06/20  9:58 AM   Specimen: Nasopharyngeal Swab  Result Value Ref Range Status   SARS Coronavirus 2 NEGATIVE NEGATIVE Final  Comment: (NOTE) SARS-CoV-2 target nucleic acids are NOT DETECTED.  The SARS-CoV-2 RNA is generally detectable in upper and lower respiratory specimens during the acute phase of infection. Negative results do not preclude SARS-CoV-2 infection, do not rule out co-infections with other pathogens, and should not be used as the sole basis for treatment or other patient management decisions. Negative results must be combined with clinical observations, patient history, and epidemiological information. The expected result is  Negative.  Fact Sheet for Patients: SugarRoll.be  Fact Sheet for Healthcare Providers: https://www.woods-mathews.com/  This test is not yet approved or cleared by the [place] FDA and  has been authorized for detection and/or diagnosis of SARS-CoV-2 by FDA under an Emergency Use Authorization (EUA). This EUA will remain  in effect (meaning this test can be used) for the duration of the COVID-19 declaration under Se ction 564(b)(1) of the Act, 21 U.S.C. section 360bbb-3(b)(1), unless the authorization is terminated or revoked sooner.  Performed at [facility], [address] [place], [place] [address]     Renal Function: Recent Labs    09/06/20 5284  CREATININE 0.87   Estimated Creatinine Clearance: 96.8 mL/min (by C-G formula based on SCr of 0.87 mg/dL).  Radiologic Imaging: No results found.  Impression/Assessment:  PCa  Plan:  I 125 brachytherapy, placement of SpaceOAR

## 2020-09-10 NOTE — Op Note (Signed)
Preoperative diagnosis: Clinical stage TI C adenocarcinoma the prostate   Postoperative diagnosis: Same   Procedure: I-125 prostate seed implantation, SpaceOAR placement, flexible cystoscopy  Surgeon: Lillette Boxer. Lillith Mcneff M.D.  Radiation Oncologist: Tyler Pita, M.D.  Anesthesia: Gen.   Indications: Patient  was diagnosed with clinical stage TI C prostate cancer. We had extensive discussion with him about treatment options versus. He elected to proceed with seed implantation. He underwent consultation my office as well as with Dr. Tammi Klippel. He appeared to understand the advantages disadvantages potential risks of this treatment option. Full informed consent has been obtained.   Technique and findings: Patient was brought the operating room where he had successful induction of general anesthesia. He was placed in dorso-lithotomy position and prepped and draped in usual manner. Appropriate surgical timeout was performed. Radiation oncology department placed a transrectal ultrasound probe anchoring stand. Foley catheter with contrast in the balloon was inserted without difficulty. Anchoring needles were placed within the prostate. Rectal tube was placed. Real-time contouring of the urethra prostate and rectum were performed and the dosing parameters were established. Targeted dose was 145 gray.  I was then called  to the operating suite suite for placement of the needles. A second timeout was performed. All needle passage was done with real-time transrectal ultrasound guidance with the sagittal plane. A total of 19 needles were placed.  72 active seeds were implanted.  I then proceeded with placement of SpaceOAR by introducing a needle with the bevel angled inferiorly approximately 2 cm superior to the anus. This was angled downward and under direct ultrasound was placed within the space between the prostatic capsule and rectum. This was confirmed with a small amount of sterile saline injected and  this was performed under direct ultrasound. I then attached the SpaceOAR to the needle and injected this in the space between the prostate and rectum with good placement noted. The Foley catheter was removed and flexible cystoscopy failed to show any seeds outside the prostate.  The patient was brought to recovery room in stable condition, having tolerated the procedure well.Marland Kitchen

## 2020-09-10 NOTE — Progress Notes (Signed)
  Radiation Oncology         [phone] ________________________________  Name: Joshua [name] MRN: [id number]  Date: 09/10/2020  DOB: 1946-05-15       Prostate Seed Implant  PZ:WCHEN, [name], MD  No ref. provider found  DIAGNOSIS: 75 y.o. gentleman with Stage T1c adenocarcinoma of the prostate with Gleason score of 4+3, and PSA of 6.06.  Oncology History  Primary malignant neoplasm of prostate ([place])  03/22/2020 Cancer Staging   Staging form: Prostate, AJCC 8th Edition - Clinical stage from 03/22/2020: Stage IIC (cT1c, cN0, cM0, PSA: 6.1, Grade Group: 3) - Signed by Freeman Caldron, PA-C on 07/11/2020 Histopathologic type: Adenocarcinoma, NOS Stage prefix: Initial diagnosis Prostate specific antigen (PSA) range: Less than 10 Gleason primary pattern: 4 Gleason secondary pattern: 3 Gleason score: 7 Histologic grading system: 5 grade system Number of biopsy cores examined: 12 Number of biopsy cores positive: 3 Location of positive needle core biopsies: One side   07/10/2020 Initial Diagnosis   Primary malignant neoplasm of prostate ([place])     PROCEDURE: Insertion of radioactive I-125 seeds into the prostate gland.  RADIATION DOSE: 145 Gy, definitive therapy.  TECHNIQUE: [name] was brought to the operating room with the urologist. He was placed in the dorsolithotomy position. He was catheterized and a rectal tube was inserted. The perineum was shaved, prepped and draped. The ultrasound probe was then introduced into the rectum to see the prostate gland.  TREATMENT DEVICE: A needle grid was attached to the ultrasound probe stand and anchor needles were placed.  3D PLANNING: The prostate was imaged in 3D using a sagittal sweep of the prostate probe. These images were transferred to the planning computer. There, the prostate, urethra and rectum were defined on each axial reconstructed image. Then, the software created an optimized 3D plan and a few seed positions were  adjusted. The quality of the plan was reviewed using [facility] information for the target and the following two organs at risk:  Urethra and Rectum.  Then the accepted plan was printed and handed off to the radiation therapist.  Under my supervision, the custom loading of the seeds and spacers was carried out and loaded into sealed vicryl sleeves.  These pre-loaded needles were then placed into the needle holder.Marland Kitchen  PROSTATE VOLUME STUDY:  Using transrectal ultrasound the volume of the prostate was verified to be 44.3 cc.  SPECIAL TREATMENT PROCEDURE/SUPERVISION AND HANDLING: The pre-loaded needles were then delivered under sagittal guidance. A total of 19 needles were used to deposit 72 seeds in the prostate gland. The individual seed activity was 0.466 mCi.  SpaceOAR:  Yes  COMPLEX SIMULATION: At the end of the procedure, an anterior radiograph of the pelvis was obtained to document seed positioning and count. Cystoscopy was performed to check the urethra and bladder.  MICRODOSIMETRY: At the end of the procedure, the patient was emitting 0.057 mR/hr at 1 meter. Accordingly, he was considered safe for hospital discharge.  PLAN: The patient will return to the radiation oncology clinic for post implant CT dosimetry in three weeks.   ________________________________  Sheral Apley Tammi Klippel, M.D.

## 2020-09-10 NOTE — Anesthesia Postprocedure Evaluation (Signed)
Anesthesia Post Note  Patient: Cordaro Mukai Mo  Procedure(s) Performed: RADIOACTIVE SEED IMPLANT/BRACHYTHERAPY IMPLANT (N/A Prostate) SPACE OAR INSTILLATION (N/A Rectum) CYSTOSCOPY FLEXIBLE (N/A Bladder)     Patient location during evaluation: PACU Anesthesia Type: General Level of consciousness: awake and alert Pain management: pain level controlled Vital Signs Assessment: post-procedure vital signs reviewed and stable Respiratory status: spontaneous breathing, nonlabored ventilation, respiratory function stable and patient connected to nasal cannula oxygen Cardiovascular status: blood pressure returned to baseline and stable Postop Assessment: no apparent nausea or vomiting Anesthetic complications: no   No complications documented.  Last Vitals:  Vitals:   09/10/20 1315 09/10/20 1330  BP: 138/81 138/83  Pulse: 73 72  Resp: 12 10  Temp:    SpO2: 94% 94%    Last Pain:  Vitals:   09/10/20 1330  PainSc: 2                  Belenda Cruise P Fred Hammes

## 2020-09-10 NOTE — Discharge Instructions (Signed)
Radioactive Seed Implant Home Care Instructions   Activity:    Rest for the remainder of the day.  Do not drive or operate equipment today.  You may resume normal  activities in a few days as instructed by your physician, without risk of harmful radiation exposure to those around you, provided you follow the time and distance precautions on the Radiation Oncology Instruction Sheet.   Meals: Drink plenty of lipuids and eat light foods, such as gelatin or soup this evening .  You may return to normal meal plan tomorrow.  Return To Work: You may return to work as instructed by Naval [employer].  Special Instruction:   If any seeds are found, use tweezers to pick up seeds and place in a glass container of any kind and bring to your physician's office.  Call your physician if any of these symptoms occur:   Persistent or heavy bleeding  Urine stream diminishes or stops completely after catheter is removed  Fever equal to or greater than 101 degrees F  Cloudy urine with a strong foul odor  Severe pain  You may feel some burning pain and/or hesitancy when you urinate after the catheter is removed.  These symptoms may increase over the next few weeks, but should diminish within forur to six weeks.  Applying moist heat to the lower abdomen or a hot tub bath may help relieve the pain.  If the discomfort becomes severe, please call your physician for additional medications.     Post Anesthesia Home Care Instructions  Activity: Get plenty of rest for the remainder of the day. A responsible individual must stay with you for 24 hours following the procedure.  For the next 24 hours, DO NOT: -Drive a car -Paediatric nurse -Drink alcoholic beverages -Take any medication unless instructed by your physician -Make any legal decisions or sign important papers.  Meals: Start with liquid foods such as gelatin or soup. Progress to regular foods as tolerated. Avoid greasy, spicy, heavy foods. If  nausea and/or vomiting occur, drink only clear liquids until the nausea and/or vomiting subsides. Call your physician if vomiting continues.  Special Instructions/Symptoms: Your throat may feel dry or sore from the anesthesia or the breathing tube placed in your throat during surgery. If this causes discomfort, gargle with warm salt water. The discomfort should disappear within 24 hours.  If you had a scopolamine patch placed behind your ear for the management of post- operative nausea and/or vomiting:  1. The medication in the patch is effective for 72 hours, after which it should be removed.  Wrap patch in a tissue and discard in the trash. Wash hands thoroughly with soap and water. 2. You may remove the patch earlier than 72 hours if you experience unpleasant side effects which may include dry mouth, dizziness or visual disturbances. 3. Avoid touching the patch. Wash your hands with soap and water after contact with the patch.

## 2020-09-10 NOTE — Interval H&P Note (Signed)
History and Physical Interval Note:  09/10/2020 11:10 AM  Joshua [name]  has presented today for surgery, with the diagnosis of PROSTATE CANCER.  The various methods of treatment have been discussed with the patient and family. After consideration of risks, benefits and other options for treatment, the patient has consented to  Procedure(s) with comments: RADIOACTIVE SEED IMPLANT/BRACHYTHERAPY IMPLANT (N/A) -     seeds implanted SPACE OAR INSTILLATION (N/A) CYSTOSCOPY FLEXIBLE (N/A) - no seeds found in bladder as a surgical intervention.  The patient's history has been reviewed, patient examined, no change in status, stable for surgery.  I have reviewed the patient's chart and labs.  Questions were answered to the patient's satisfaction.     Lillette Boxer [place] Franson

## 2020-09-11 ENCOUNTER — Encounter (HOSPITAL_BASED_OUTPATIENT_CLINIC_OR_DEPARTMENT_OTHER): Payer: Self-pay | Admitting: Urology

## 2020-10-10 ENCOUNTER — Telehealth: Payer: Self-pay | Admitting: *Deleted

## 2020-10-10 NOTE — Telephone Encounter (Signed)
CALLED PATIENT TO REMIND OF POST SEED APPTS. FOR 10-11-20, SPOKE WITH PATIENT'S WIFE- GENNELL AND SHE IS AWARE OF THESE APPTS.

## 2020-10-11 ENCOUNTER — Ambulatory Visit
Admission: RE | Admit: 2020-10-11 | Discharge: 2020-10-11 | Disposition: A | Discharge status: Home / Self Care | Payer: Medicare Other | Source: Ambulatory Visit | Attending: Urology | Admitting: Urology

## 2020-10-11 ENCOUNTER — Other Ambulatory Visit: Payer: Self-pay

## 2020-10-11 ENCOUNTER — Encounter: Payer: Self-pay | Admitting: Medical Oncology

## 2020-10-11 ENCOUNTER — Ambulatory Visit
Admission: RE | Admit: 2020-10-11 | Discharge: 2020-10-11 | Disposition: A | Discharge status: Home / Self Care | Payer: Medicare Other | Source: Ambulatory Visit | Attending: Radiation Oncology | Admitting: Radiation Oncology

## 2020-10-11 ENCOUNTER — Encounter: Payer: Self-pay | Admitting: Urology

## 2020-10-11 VITALS — BP 139/80 | HR 69 | Resp 18 | Ht 70.0 in | Wt 260.0 lb

## 2020-10-11 DIAGNOSIS — Z791 Long term (current) use of non-steroidal anti-inflammatories (NSAID): Secondary | ICD-10-CM | POA: Insufficient documentation

## 2020-10-11 DIAGNOSIS — Z923 Personal history of irradiation: Secondary | ICD-10-CM | POA: Insufficient documentation

## 2020-10-11 DIAGNOSIS — R300 Dysuria: Secondary | ICD-10-CM | POA: Insufficient documentation

## 2020-10-11 DIAGNOSIS — C61 Malignant neoplasm of prostate: Secondary | ICD-10-CM | POA: Insufficient documentation

## 2020-10-11 DIAGNOSIS — Z79899 Other long term (current) drug therapy: Secondary | ICD-10-CM | POA: Insufficient documentation

## 2020-10-11 DIAGNOSIS — R351 Nocturia: Secondary | ICD-10-CM | POA: Insufficient documentation

## 2020-10-11 NOTE — Progress Notes (Signed)
Radiation Oncology         [phone] ________________________________  Name: GABLE ODONOHUE MRN: [id number]  Date: 10/11/2020  DOB: 11-02-1945  Post-Seed Follow-Up Visit Note  CC: Girtha Hake, MD  Franchot Gallo, MD  Diagnosis:   75 y.o. gentleman with Stage T1c adenocarcinoma of the prostate with Gleason score of 4+3, and PSA of 6.06.    ICD-10-CM   1. Primary malignant neoplasm of prostate ([facility])  C61     Interval Since Last Radiation:  4.5 weeks 09/10/20:  Insertion of radioactive I-125 seeds into the prostate gland; 145 Gy, definitive therapy with placement of SpaceOAR Vue gel.  Narrative:  The patient returns today for routine follow-up.  He is complaining of increased urinary frequency and urinary hesitation symptoms. He filled out a questionnaire regarding urinary function today providing and overall IPSS score of 29 characterizing his symptoms as severe but tolerable and improving.  He continues with mild dysuria at the start of his stream, hesitancy, frequency, urgency and nocturia x3. He denies gross hematuria, malodorous urine, fever or chills. He is not currently taking an alpha blocker although he was offered a Rx for Flomax at his visit with Levonne Hubert, NP on 10/01/20.  His pre-implant score was 19. He denies any bowel symptoms and reports a healthy appetite. His energy level is gradually returning and overall, he is pleased with his progress to date.  ALLERGIES:  has No Known Allergies.  Meds: Current Outpatient Medications  Medication Sig Dispense Refill  . acetaminophen (TYLENOL) 325 MG tablet Take 1-2 tablets (325-650 mg total) by mouth every 6 (six) hours as needed for mild pain (pain score 1-3 or temp > 100.5). (Patient not taking: No sig reported) 90 tablet 0  . amLODipine (NORVASC) 10 MG tablet Take 10 mg by mouth daily.    Marland Kitchen atorvastatin (LIPITOR) 20 MG tablet Take 20 mg by mouth at bedtime.    . Cyanocobalamin (B-12) 2500 MCG TABS Take by mouth daily.    .  diclofenac Sodium (VOLTAREN) 1 % GEL Apply topically 4 (four) times daily as needed.    . hydrochlorothiazide (HYDRODIURIL) 25 MG tablet Take 1 tablet by mouth daily.    Marland Kitchen levothyroxine (SYNTHROID) 112 MCG tablet Take 1 tablet by mouth daily.    Marland Kitchen losartan (COZAAR) 100 MG tablet Take 100 mg by mouth daily.    . Metoprolol Succinate 100 MG CS24 Take 50 mg by mouth daily.    Marland Kitchen pyridoxine (B-6) 100 MG tablet Take 1 tablet by mouth daily.     No current facility-administered medications for this encounter.    Physical Findings: In general this is a well appearing Caucasian male in no acute distress. He's alert and oriented x4 and appropriate throughout the examination. Cardiopulmonary assessment is negative for acute distress and he exhibits normal effort.   Lab Findings: Lab Results  Component Value Date   WBC 6.3 09/06/2020   HGB 14.1 09/06/2020   HCT 40.6 09/06/2020   MCV 89.4 09/06/2020   PLT 160 09/06/2020    Radiographic Findings:  Patient underwent CT imaging in our clinic for post implant dosimetry. The CT will be reviewed by Dr. Tammi Klippel to confirm there is an adequate distribution of radioactive seeds throughout the prostate gland and ensure that there are no seeds in or near the rectum. We suspect the final radiation plan and dosimetry will show appropriate coverage of the prostate gland. He understands that we will call and inform him of any unexpected findings on  further review of his imaging and dosimetry.  Impression/Plan: 75 y.o. gentleman with Stage T1c adenocarcinoma of the prostate with Gleason score of 4+3, and PSA of 6.06. The patient is recovering from the effects of radiation. His urinary symptoms should gradually improve over the next 4-6 months. We talked about this today. He is encouraged by his improvement already and is otherwise pleased with his outcome. We also talked about long-term follow-up for prostate cancer following seed implant. He understands that ongoing  PSA determinations and digital rectal exams will help perform surveillance to rule out disease recurrence. He saw Daine Gravel, NP at [facility] on 10/01/20 and has a follow up appointment scheduled with Dr. Diona Fanti on 12/12/20. He understands what to expect with his PSA measures. Patient was also educated today about some of the long-term effects from radiation including a small risk for rectal bleeding and possibly erectile dysfunction. We talked about some of the general management approaches to these potential complications. However, I did encourage the patient to contact our office or return at any point if he has questions or concerns related to his previous radiation and prostate cancer.    Nicholos Johns, PA-C

## 2020-10-11 NOTE — Progress Notes (Signed)
  Radiation Oncology         [phone] ________________________________  Name: Joshua [name] MRN: [id number]  Date: 10/11/2020  DOB: 1945/09/23  COMPLEX SIMULATION NOTE  NARRATIVE:  The patient was brought to the [facility] today following prostate seed implantation approximately one month ago.  Identity was confirmed.  All relevant records and images related to the planned course of therapy were reviewed.  Then, the patient was set-up supine.  CT images were obtained.  The CT images were loaded into the planning software.  Then the prostate and rectum were contoured.  Treatment planning then occurred.  The implanted iodine 125 seeds were identified by the physics staff for projection of radiation distribution  I have requested : 3D Simulation  I have requested a DVH of the following structures: Prostate and rectum.    ________________________________  Sheral Apley Tammi Klippel, M.D.

## 2020-10-11 NOTE — Progress Notes (Signed)
Patient is here today for a post -seed follow-up. Patient reports  Dysuria Yes Hematuria None Nocturia x  3 Leakage  None Urgency None Emptying bladder Sometime Stream Weak Push /strain to start stream sometime Stop /go during urination Varies  Constipation/Diarrhea None  Next appointment with urologist  Seen urologist last week Patient IPPS  Was a 29 Vitals:   10/11/20 1330  BP: 139/80  Pulse: 69  Resp: 18  SpO2: 100%  Weight: 117.9 kg  Height: 5\' 10"  (1.778 m)

## 2020-11-02 ENCOUNTER — Encounter: Payer: Self-pay | Admitting: Radiation Oncology

## 2020-11-02 ENCOUNTER — Ambulatory Visit
Admission: RE | Admit: 2020-11-02 | Discharge: 2020-11-02 | Disposition: A | Discharge status: Home / Self Care | Payer: Medicare Other | Source: Ambulatory Visit | Attending: Radiation Oncology | Admitting: Radiation Oncology

## 2020-11-02 DIAGNOSIS — C61 Malignant neoplasm of prostate: Secondary | ICD-10-CM | POA: Insufficient documentation

## 2020-11-21 NOTE — Progress Notes (Signed)
  Radiation Oncology         [phone] ________________________________  Name: Joshua [name] MRN: [id number]  Date: 11/02/2020  DOB: 1946/02/10  3D Planning Note   Prostate Brachytherapy Post-Implant Dosimetry  Diagnosis: 75 y.o. gentleman with Stage T1c adenocarcinoma of the prostate with Gleason score of 4+3, and PSA of 6.06.  Narrative: On a previous date, Joshua [name] returned following prostate seed implantation for post implant planning. He underwent CT scan complex simulation to delineate the three-dimensional structures of the pelvis and demonstrate the radiation distribution.  Since that time, the seed localization, and complex isodose planning with dose volume histograms have now been completed.  Results:   Prostate Coverage - The dose of radiation delivered to the 90% or more of the prostate gland (D90) was 109.31% of the prescription dose. This exceeds our goal of greater than 90%. Rectal Sparing - The volume of rectal tissue receiving the prescription dose or higher was 0.0 cc. This falls under our thresholds tolerance of 1.0 cc.  Impression: The prostate seed implant appears to show adequate target coverage and appropriate rectal sparing.  Plan:  The patient will continue to follow with urology for ongoing PSA determinations. I would anticipate a high likelihood for local tumor control with minimal risk for rectal morbidity.  ________________________________  Sheral Apley Tammi Klippel, M.D.

## 2021-05-15 IMAGING — RF DG HIP (WITH PELVIS) OPERATIVE*R*
1 series · 2 of 2 positions shown · non-contrast
Comparison: None.

CLINICAL DATA: Right hip replacement

EXAM:
OPERATIVE RIGHT HIP WITH PELVIS

[Series 1: unknown protocol · 0.20mm/px · 2 of 2 slices shown]
[im 1/2]
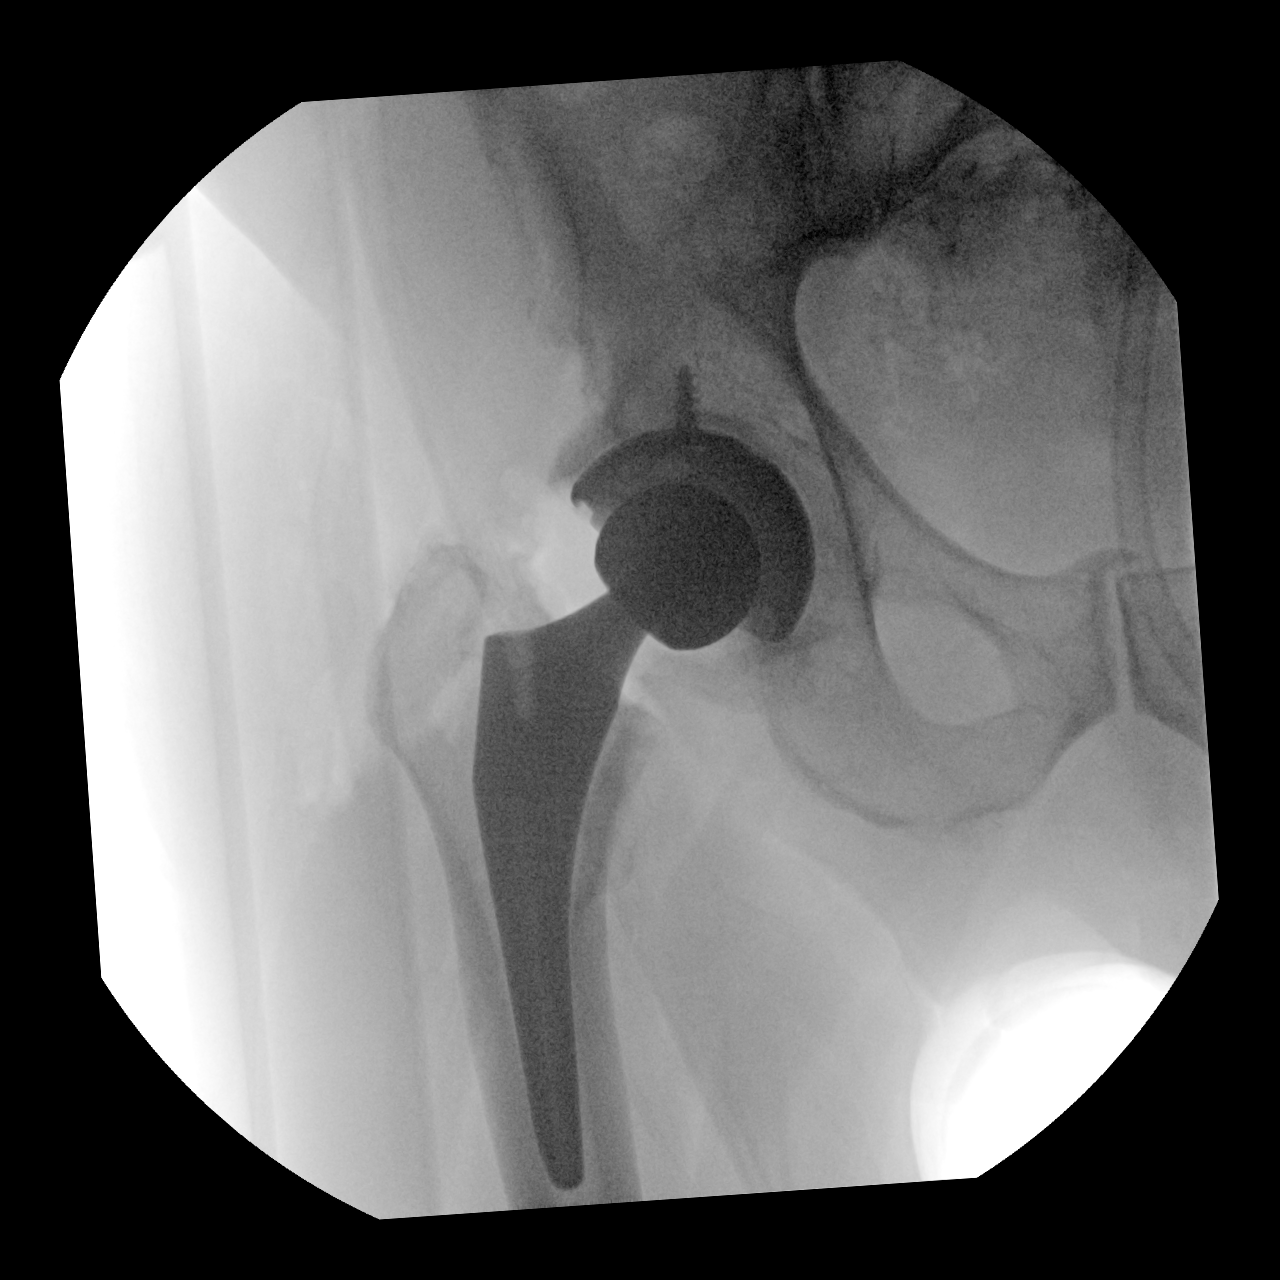
[im 2/2]
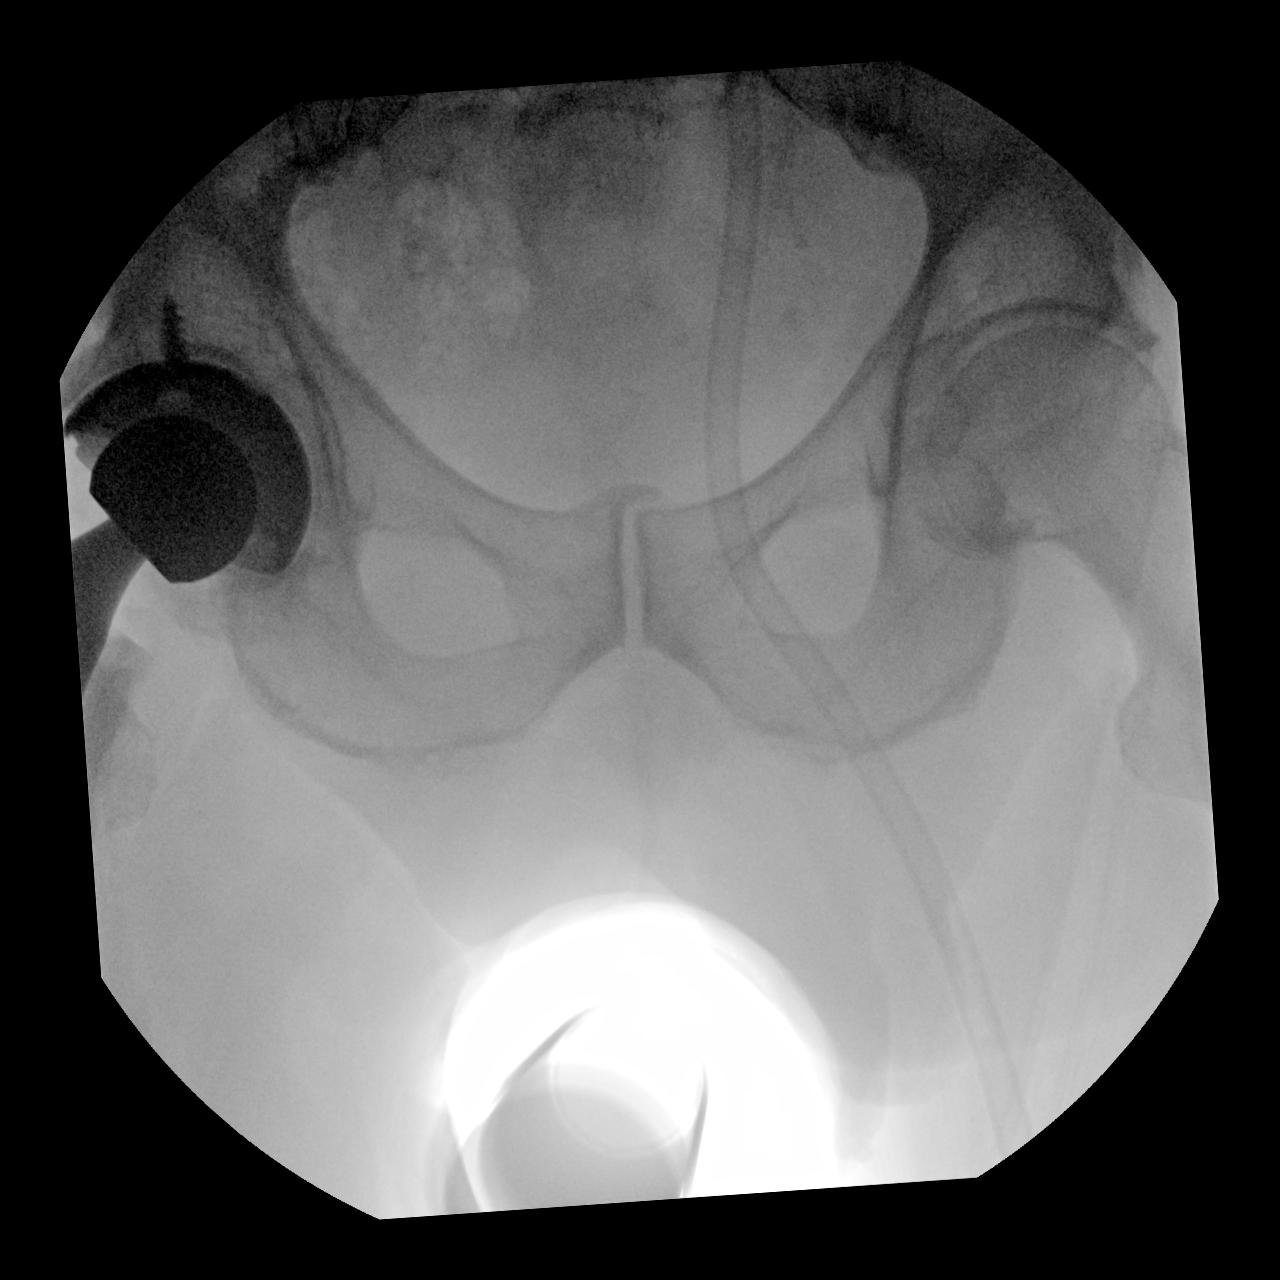

[2 of 2 positions shown; findings below may reference images not displayed]

FLUOROSCOPY TIME:  Radiation Exposure Index (as provided by the
fluoroscopic device): Not available

If the device does not provide the exposure index:

Fluoroscopy Time:  10 seconds

Number of Acquired Images:  2
FINDINGS: Right hip prosthesis is noted in satisfactory position. Degenerative
changes in the pubic symphysis are seen.
IMPRESSION: Status post right hip replacement

## 2021-12-03 IMAGING — NM NM BONE WHOLE BODY
2 series · 2 of 2 positions shown · non-contrast
Comparison: 11/01/2019

CLINICAL DATA: Prostate cancer, PSA 6.06, right hip arthroplasty
12/06/2019, left knee arthroplasty 12/27/2019

EXAM:
NUCLEAR MEDICINE WHOLE BODY BONE SCAN
TECHNIQUE: Whole body anterior and posterior images were obtained approximately
3 hours after intravenous injection of radiopharmaceutical.
RADIOPHARMACEUTICALS:  21.6 mCi Technetium-EEm MDP IV

[Series 1: whole body · 2.66mm/px · 1 of 1 slices shown (1 of 2)]
[im 1/1]
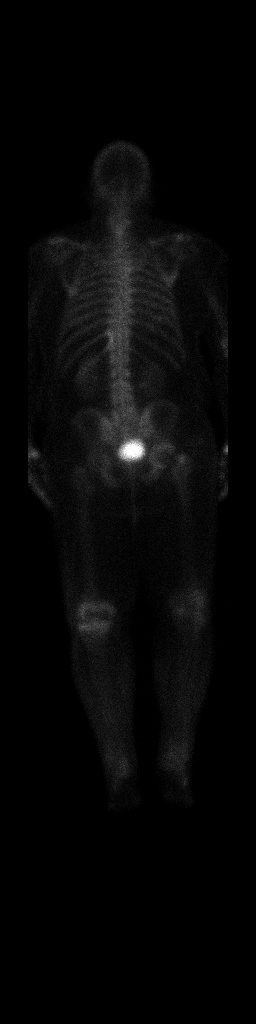

[Series 1: whole body · 2.66mm/px · 1 of 1 slices shown (2 of 2)]
[im 1/1]
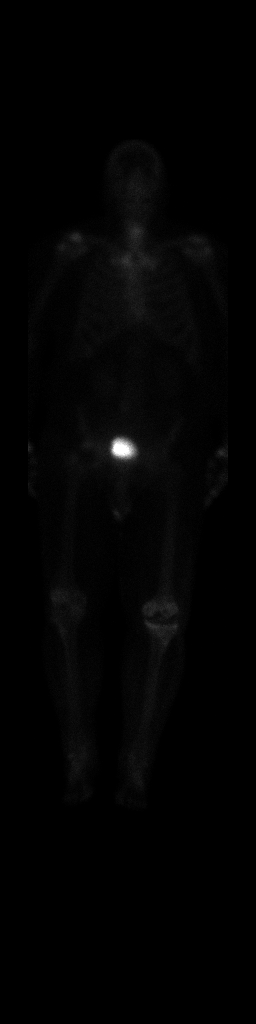

[2 of 2 positions shown; findings below may reference images not displayed]

FINDINGS: Anterior and posterior whole body planar images are obtained after
radiotracer administration. There is physiologic excretion of
radiotracer within the kidneys and bladder.

Photopenia of the right hip and left knee consistent with known
arthroplasties. Normal postsurgical activity surrounds the
arthroplasties.

Degenerative type activity is seen in the mid cervical spine,
bilateral shoulders, sternoclavicular joints, wrists, and ankles. I
do not see any focal radiotracer activity to suggest an acute or
destructive process. No evidence of bony metastases.
IMPRESSION: 1. No evidence of bony metastatic disease.
2. Postsurgical changes of right hip arthroplasty and left knee
arthroplasty.
3. Multifocal degenerative changes.

## 2023-08-24 ENCOUNTER — Telehealth: Payer: Self-pay | Admitting: Radiation Oncology

## 2023-08-24 NOTE — Telephone Encounter (Addendum)
 3/31 @ 9:12 am Patient called for follow up consult with Dr. Kathrynn Running.  No referral sent/patient self referral.  Patient was seen and treatment in 11/02/20.  Inbasket sent to [employer] and copied Chesley Noon, so they are aware.

## 2023-08-27 NOTE — Progress Notes (Signed)
 Radiation Oncology         [phone] ________________________________  Outpatient Re-Consultation  Name: Joshua [name] MRN: [id number]  Date of Service: 08/28/2023 DOB: [birth year]  QI:ONGEX, Joshua Fanning, MD  Joshua [name]*   REFERRING PHYSICIAN: Shelly [name]*  DIAGNOSIS: There were no encounter diagnoses.  No diagnosis found.  HISTORY OF PRESENT ILLNESS: Joshua [name] is a 78 y.o. [name] seen at the request of Dr. Marland [name] In summary, he was initially referred to Dr. Retta [name] in 07/2019 for an elevated PSA of 7.98. Repeat PSA showed a drop to 6.06. He underwent prostate biopsy on 03/22/20 showing Gleason 4+3 disease in 3/12 cores. Staging scans were negative for metastatic disease. We met the patient in consultation on 07/10/20. At that time, he opted to proceed with brachytherapy seed implant and was taken for the procedure on 09/10/20.  Since that time, his PSA reached a nadir of 0.88 in 10/2021. This remained stable at 1 in 11/2022 but has been rising again recently. A repeat with his PCP on 06/09/23 was 3.51 and with Dr. Liliane [name] on 06/30/23 was 3.68.***  PREVIOUS RADIATION THERAPY: Yes  09/10/20:  Insertion of radioactive I-125 seeds into the prostate gland; 145 Gy, definitive therapy with placement of SpaceOAR Vue gel.   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   CYSTOSCOPY N/A 09/10/2020   Procedure: CYSTOSCOPY FLEXIBLE;  Surgeon: Joshua Matar, MD;  Location: [facility];  Service: Urology;  Laterality: N/A;  no seeds found in bladder   ESOPHAGOGASTRODUODENOSCOPY  2016   per pt had cauterization of bleeding ulcers   INJECTION KNEE Left 11/01/2019   Procedure: KNEE INJECTION;  Surgeon: Sheral Apley, MD;  Location: [facility];  Service: Orthopedics;  Laterality: Left;   RADICAL ORCHIECTOMY Left 1994   RADIOACTIVE SEED IMPLANT N/A 09/10/2020   Procedure: RADIOACTIVE SEED IMPLANT/BRACHYTHERAPY IMPLANT;  Surgeon: Joshua Matar, MD;  Location: [facility];  Service: Urology;  Laterality: N/A;   72 seeds implanted   SPACE OAR INSTILLATION N/A 09/10/2020   Procedure: SPACE OAR INSTILLATION;  Surgeon: Joshua Matar, MD;  Location: [facility];  Service: Urology;  Laterality: N/A;   TONSILLECTOMY  child   TOTAL HIP ARTHROPLASTY Right 11/01/2019   Procedure: TOTAL HIP ARTHROPLASTY ANTERIOR APPROACH;  Surgeon: Sheral Apley, MD;  Location: [facility];  Service: Orthopedics;  Laterality: Right;   TOTAL KNEE ARTHROPLASTY Left 12/27/2019   Procedure: TOTAL KNEE ARTHROPLASTY;  Surgeon: Sheral Apley, MD;  Location: [facility];  Service:  Orthopedics;  Laterality: Left;    FAMILY HISTORY:  Family History  Problem Relation Age of Onset   Breast cancer Mother 83   Prostate cancer Neg Hx    Pancreatic cancer Neg Hx    Colon cancer Neg Hx     SOCIAL HISTORY:  Social History   Socioeconomic History   Marital status: Married    Spouse name: Not on file   Number of children: 4   Years of education: Not on file   Highest education level: Not on file   Occupational History   Not on file  Tobacco Use   Smoking status: Never   Smokeless tobacco: Never  Vaping Use   Vaping status: Never Used  Substance and Sexual Activity   Alcohol use: Yes    Alcohol/week: 2.0 standard drinks of alcohol    Types: 2 Cans of beer per week   Drug use: Never   Sexual activity: Not on file  Other Topics Concern   Not on file  Social History Narrative   [place]   Social Drivers of Health   Financial Resource Strain: Not on file  Food Insecurity: Not on file  Transportation Needs: Not on file  Physical Activity: Not on file  Stress: Not on file  Social Connections: Not on file  Intimate Partner Violence: Not on file    ALLERGIES: Patient has no known allergies.  MEDICATIONS:  Current Outpatient Medications  Medication Sig Dispense Refill   acetaminophen (TYLENOL) 325 MG tablet Take 1-2 tablets (325-650 mg total) by mouth every 6 (six) hours as needed for mild pain (pain score 1-3 or temp > 100.5). (Patient not taking: No sig reported) 90 tablet 0   amLODipine (NORVASC) 10 MG tablet Take 10 mg by mouth daily.     atorvastatin (LIPITOR) 20 MG tablet Take 20 mg by mouth at bedtime.     Cyanocobalamin (B-12) 2500 MCG TABS Take by mouth daily. (Patient not taking: Reported on 10/11/2020)     diclofenac Sodium (VOLTAREN) 1 % GEL Apply topically 4 (four) times daily as needed. (Patient not taking: Reported on 10/11/2020)     hydrochlorothiazide (HYDRODIURIL) 25 MG tablet Take 1 tablet by mouth daily.     levothyroxine (SYNTHROID) 112 MCG tablet Take 1 tablet by mouth daily.     losartan (COZAAR) 100 MG tablet Take 100 mg by mouth daily.     Metoprolol Succinate 100 MG CS24 Take 50 mg by mouth daily.     pyridoxine (B-6) 100 MG tablet Take 1 tablet by mouth daily. (Patient not taking: Reported on 10/11/2020)     No current facility-administered medications for this encounter.    REVIEW OF SYSTEMS:  On review of systems, the  patient reports that he is doing well overall. He denies any chest pain, shortness of breath, cough, fevers, chills, night sweats, unintended weight changes. He denies any bowel or bladder disturbances, and denies abdominal pain, nausea or vomiting. He denies any new musculoskeletal or joint aches or pains.*** A complete review of systems is obtained and is otherwise negative.    PHYSICAL EXAM:  Wt Readings from Last 3 Encounters:  10/11/20 260 lb (117.9 kg)  09/10/20 276 lb 9.6 oz (125.5 kg)  09/06/20 261 lb (118.4 kg)   Temp Readings from Last 3 Encounters:  09/10/20 97.6 F (36.4 C)  09/06/20 98.2 F (36.8 C) (Oral)  07/10/20 97.6 F (36.4 C)   BP Readings from Last 3 Encounters:  10/11/20 139/80  09/10/20 Joshua [name])  147/87  09/06/20 139/77   Pulse Readings from Last 3 Encounters:  10/11/20 69  09/10/20 62  09/06/20 69    /10  In general this is a well appearing *** man in no acute distress. He's alert and oriented x4 and appropriate throughout the examination. Cardiopulmonary assessment is negative for acute distress and he exhibits normal effort.     KPS = ***  100 - Normal; no complaints; no evidence of disease. 90   - Able to carry on normal activity; minor signs or symptoms of disease. 80   - Normal activity with effort; some signs or symptoms of disease. 15   - Cares for self; unable to carry on normal activity or to do active work. 60   - Requires occasional assistance, but is able to care for most of his personal needs. 50   - Requires considerable assistance and frequent medical care. 40   - Disabled; requires special care and assistance. 30   - Severely disabled; hospital admission is indicated although death not imminent. 20   - Very sick; hospital admission necessary; active supportive treatment necessary. 10   - Moribund; fatal processes progressing rapidly. 0     - Dead  Karnofsky DA, Abelmann WH, Craver LS and Burchenal [facility] [phone]) The use of the nitrogen  mustards in the palliative treatment of carcinoma: with particular reference to bronchogenic carcinoma Cancer 1 634-56  LABORATORY DATA:  Lab Results  Component Value Date   WBC 6.3 09/06/2020   HGB 14.1 09/06/2020   HCT 40.6 09/06/2020   MCV 89.4 09/06/2020   PLT 160 09/06/2020   Lab Results  Component Value Date   NA 139 09/06/2020   K 4.2 09/06/2020   CL 105 09/06/2020   CO2 28 09/06/2020   Lab Results  Component Value Date   ALT 39 09/06/2020   AST 30 09/06/2020   ALKPHOS 54 09/06/2020   BILITOT 0.9 09/06/2020     RADIOGRAPHY: No results found.    IMPRESSION/PLAN: 1. 78 y.o. man with rising PSA of 3.68 s/p brachytherapy in 08/2020 for Gleason 4+3 prostate cancer***  Today, we talked to the patient and family about the findings and workup thus far. We discussed the natural history of prostate cancer and general treatment, highlighting the role of radiotherapy in the management. We discussed the available radiation techniques, and focused on the details and logistics of delivery. We reviewed the anticipated acute and late sequelae associated with radiation in this setting. The patient was encouraged to ask questions that were answered to his satisfaction.    I personally spent *** minutes in this encounter including chart review, reviewing radiological studies, meeting face-to-face with the patient, entering orders and completing documentation.    Marguarite Arbour, PA-C    Margaretmary Dys, MD  [facility]  Radiation Oncology Direct Dial: [phone]  Fax: [phone] [email or web address]  Skype  [email or web address]   This document serves as a record of services personally performed by Margaretmary Dys, MD and Marcello Fennel, PA-C. It was created on their behalf by Mickie Bail, a trained medical scribe. The creation of this record is based on the scribe's personal observations and the provider's statements to them. This document has been checked and approved by the  attending provider.

## 2023-08-28 ENCOUNTER — Encounter: Payer: Self-pay | Admitting: Urology

## 2023-08-28 ENCOUNTER — Other Ambulatory Visit: Payer: Self-pay

## 2023-08-28 ENCOUNTER — Ambulatory Visit
Admission: RE | Admit: 2023-08-28 | Discharge: 2023-08-28 | Disposition: A | Discharge status: Home / Self Care | Source: Ambulatory Visit | Attending: Urology | Admitting: Urology

## 2023-08-28 ENCOUNTER — Ambulatory Visit
Admission: RE | Admit: 2023-08-28 | Discharge: 2023-08-28 | Disposition: A | Discharge status: Home / Self Care | Source: Ambulatory Visit | Attending: Radiation Oncology | Admitting: Radiation Oncology

## 2023-08-28 VITALS — BP 149/83 | HR 68 | Temp 97.1°F | Resp 18 | Ht 70.0 in | Wt 261.2 lb

## 2023-08-28 DIAGNOSIS — M1990 Unspecified osteoarthritis, unspecified site: Secondary | ICD-10-CM | POA: Diagnosis not present

## 2023-08-28 DIAGNOSIS — Z923 Personal history of irradiation: Secondary | ICD-10-CM | POA: Insufficient documentation

## 2023-08-28 DIAGNOSIS — C7951 Secondary malignant neoplasm of bone: Secondary | ICD-10-CM | POA: Insufficient documentation

## 2023-08-28 DIAGNOSIS — C61 Malignant neoplasm of prostate: Secondary | ICD-10-CM | POA: Insufficient documentation

## 2023-08-28 DIAGNOSIS — Z79899 Other long term (current) drug therapy: Secondary | ICD-10-CM | POA: Insufficient documentation

## 2023-08-28 DIAGNOSIS — E039 Hypothyroidism, unspecified: Secondary | ICD-10-CM | POA: Diagnosis not present

## 2023-08-28 DIAGNOSIS — Z79890 Hormone replacement therapy (postmenopausal): Secondary | ICD-10-CM | POA: Diagnosis not present

## 2023-08-28 DIAGNOSIS — I10 Essential (primary) hypertension: Secondary | ICD-10-CM | POA: Insufficient documentation

## 2023-08-28 DIAGNOSIS — Z8711 Personal history of peptic ulcer disease: Secondary | ICD-10-CM | POA: Diagnosis not present

## 2023-08-28 NOTE — Progress Notes (Signed)
 Reconsult nursing interview for a diagnosis of Prostate Cancer.  Patient identity verified x2.   Patient reports dysuria 3/10, polyuria, and moderate bladder weakness, and moderate urinary incontinence. Patient denies all other related issues at this time.  Meaningful use complete.  I-PSS (AUA) score- 19 - Moderate SHIM (ED) score- 1-No sexual activity within 6 months. Urinary Management medication(s) None Urology appointment date- 09/02/2023 with Dr. Liliane Shi at [facility]  SAFETY ISSUES: Prior radiation? Prostate seed implant Pacemaker/ICD? No Is the patient on methotrexate? No  Additional Complaints / other details: None  Vitals- BP (!) 149/83 (BP Location: Right Arm, Patient Position: Sitting, Cuff Size: Normal)   Pulse 68   Temp (!) 97.1 F (36.2 C) (Temporal)   Resp 18   Ht 5\' 10"  (1.778 m)   Wt 261 lb 4 oz (118.5 kg)   SpO2 96%   BMI 37.49 kg/m   This concludes the interaction.  Ruel Favors, LPN

## 2023-09-04 ENCOUNTER — Encounter (HOSPITAL_COMMUNITY)
Admission: RE | Admit: 2023-09-04 | Discharge: 2023-09-04 | Disposition: A | Discharge status: Home / Self Care | Source: Ambulatory Visit | Attending: Urology | Admitting: Urology

## 2023-09-04 DIAGNOSIS — C61 Malignant neoplasm of prostate: Secondary | ICD-10-CM | POA: Diagnosis present

## 2023-09-04 DIAGNOSIS — R9721 Rising PSA following treatment for malignant neoplasm of prostate: Secondary | ICD-10-CM | POA: Diagnosis present

## 2023-09-04 MED ORDER — FLOTUFOLASTAT F 18 GALLIUM 296-5846 MBQ/ML IV SOLN
8.0000 | Freq: Once | INTRAVENOUS | Status: AC
  Administered 2023-09-04: 8.54 via INTRAVENOUS
  Filled 2023-09-04: qty 8

## 2023-09-07 NOTE — Progress Notes (Signed)
 STAT read requested for PSMA PET.

## 2023-09-10 NOTE — Progress Notes (Signed)
 RN spoke with patient to provided results of PSMA PET showing a solitary tracer avid obturator node but no evidence of local recurrence in the prostate after review and recommendations from MD.  Per Dr. Lorri Rota we will cancel prostate biopsy that is tentatively scheduled for 09/2023 and proceed with SBRT to the oligometastasis and monitoring of PSA after treatment is completed to determine if prostate biopsy is necessary.   Patient agreeable to plan and will be scheduled for a CT Simulation. All questions answered.

## 2023-09-11 ENCOUNTER — Ambulatory Visit
Admission: RE | Admit: 2023-09-11 | Discharge: 2023-09-11 | Disposition: A | Discharge status: Home / Self Care | Source: Ambulatory Visit | Attending: Urology | Admitting: Urology

## 2023-09-11 DIAGNOSIS — Z510 Encounter for antineoplastic radiation therapy: Secondary | ICD-10-CM | POA: Insufficient documentation

## 2023-09-11 DIAGNOSIS — C61 Malignant neoplasm of prostate: Secondary | ICD-10-CM | POA: Diagnosis present

## 2023-09-15 ENCOUNTER — Encounter (HOSPITAL_COMMUNITY)

## 2023-09-15 NOTE — Progress Notes (Signed)
  Radiation Oncology         [phone] ________________________________  Name: Joshua [name] MRN: [id number]  Date: 09/11/2023  DOB: 1945-12-27  ULTRAHYPOFRACTIONATED RADIOTHERAPY  SIMULATION AND TREATMENT PLANNING NOTE    ICD-10-CM   1. Biochemically recurrent malignant neoplasm of prostate [facility])  C61    R97.21       DIAGNOSIS:  78 y/o man with biochemically recurrent prostate cancer as evidenced by a rising PSA at 3.67 s/p brachytherapy in 2022 for Gleason 4+3 adenocarcinoma of the prostate and PSA 6.06.  NARRATIVE:  The patient was brought to the CT Simulation planning suite.  Identity was confirmed.  All relevant records and images related to the planned course of therapy were reviewed.  The patient freely provided informed written consent to proceed with treatment after reviewing the details related to the planned course of therapy. The consent form was witnessed and verified by the simulation staff.  Then, the patient was set-up in a stable reproducible  supine position for radiation therapy.  A BodyFix immobilization pillow was fabricated for reproducible positioning.  Surface markings were placed.  The CT images were loaded into the planning software.  The gross target volumes (GTV) and planning target volumes (PTV) were delinieated, and avoidance structures were contoured.  Treatment planning then occurred.  The radiation prescription was entered and confirmed.  A total of two complex treatment devices were fabricated in the form of the BodyFix immobilization pillow and a neck accuform cushion.  I have requested : 3D Simulation  I have requested a DVH of the following structures: targets and all normal structures near the target including rectum, bladder and others as noted on the radiation plan to maintain doses in adherence with established limits  SPECIAL TREATMENT PROCEDURE:  The planned course of therapy using radiation constitutes a special treatment procedure. Special care is  required in the management of this patient for the following reasons. High dose per fraction requiring special monitoring for increased toxicities of treatment including daily imaging..  The special nature of the planned course of radiotherapy will require increased physician supervision and oversight to ensure patient's safety with optimal treatment outcomes.    This requires extended time and effort.    PLAN:  The patient will receive 50 Gy in 10 fractions to the single PET avid node and 30 Gy in 10 fractions to the involved nodal echelon   ________________________________  Trilby Fujisawa. Lorri Rota, M.D.

## 2023-09-21 DIAGNOSIS — Z510 Encounter for antineoplastic radiation therapy: Secondary | ICD-10-CM | POA: Diagnosis not present

## 2023-09-22 ENCOUNTER — Ambulatory Visit
Admission: RE | Admit: 2023-09-22 | Discharge: 2023-09-22 | Disposition: A | Discharge status: Home / Self Care | Source: Ambulatory Visit | Attending: Radiation Oncology | Admitting: Radiation Oncology

## 2023-09-22 ENCOUNTER — Other Ambulatory Visit: Payer: Self-pay

## 2023-09-22 DIAGNOSIS — Z510 Encounter for antineoplastic radiation therapy: Secondary | ICD-10-CM | POA: Diagnosis not present

## 2023-09-22 LAB — RAD ONC ARIA SESSION SUMMARY
Course Elapsed Days: 0
Plan Fractions Treated to Date: 1
Plan Prescribed Dose Per Fraction: 5 Gy
Plan Total Fractions Prescribed: 10
Plan Total Prescribed Dose: 50 Gy
Reference Point Dosage Given to Date: 5 Gy
Reference Point Session Dosage Given: 5 Gy
Session Number: 1

## 2023-09-23 ENCOUNTER — Other Ambulatory Visit: Payer: Self-pay

## 2023-09-23 ENCOUNTER — Ambulatory Visit
Admission: RE | Admit: 2023-09-23 | Discharge: 2023-09-23 | Disposition: A | Discharge status: Home / Self Care | Source: Ambulatory Visit | Attending: Radiation Oncology | Admitting: Radiation Oncology

## 2023-09-23 DIAGNOSIS — Z510 Encounter for antineoplastic radiation therapy: Secondary | ICD-10-CM | POA: Diagnosis not present

## 2023-09-23 LAB — RAD ONC ARIA SESSION SUMMARY
Course Elapsed Days: 1
Plan Fractions Treated to Date: 2
Plan Prescribed Dose Per Fraction: 5 Gy
Plan Total Fractions Prescribed: 10
Plan Total Prescribed Dose: 50 Gy
Reference Point Dosage Given to Date: 10 Gy
Reference Point Session Dosage Given: 5 Gy
Session Number: 2

## 2023-09-24 ENCOUNTER — Ambulatory Visit
Admission: RE | Admit: 2023-09-24 | Discharge: 2023-09-24 | Disposition: A | Discharge status: Home / Self Care | Source: Ambulatory Visit | Attending: Radiation Oncology | Admitting: Radiation Oncology

## 2023-09-24 ENCOUNTER — Other Ambulatory Visit: Payer: Self-pay

## 2023-09-24 DIAGNOSIS — Z510 Encounter for antineoplastic radiation therapy: Secondary | ICD-10-CM | POA: Diagnosis present

## 2023-09-24 DIAGNOSIS — C61 Malignant neoplasm of prostate: Secondary | ICD-10-CM | POA: Insufficient documentation

## 2023-09-24 LAB — RAD ONC ARIA SESSION SUMMARY
Course Elapsed Days: 2
Plan Fractions Treated to Date: 3
Plan Prescribed Dose Per Fraction: 5 Gy
Plan Total Fractions Prescribed: 10
Plan Total Prescribed Dose: 50 Gy
Reference Point Dosage Given to Date: 15 Gy
Reference Point Session Dosage Given: 5 Gy
Session Number: 3

## 2023-09-25 ENCOUNTER — Other Ambulatory Visit: Payer: Self-pay

## 2023-09-25 ENCOUNTER — Ambulatory Visit
Admission: RE | Admit: 2023-09-25 | Discharge: 2023-09-25 | Disposition: A | Discharge status: Home / Self Care | Source: Ambulatory Visit | Attending: Radiation Oncology

## 2023-09-25 ENCOUNTER — Ambulatory Visit
Admission: RE | Admit: 2023-09-25 | Discharge: 2023-09-25 | Disposition: A | Discharge status: Home / Self Care | Source: Ambulatory Visit | Attending: Radiation Oncology | Admitting: Radiation Oncology

## 2023-09-25 DIAGNOSIS — Z510 Encounter for antineoplastic radiation therapy: Secondary | ICD-10-CM | POA: Diagnosis not present

## 2023-09-25 LAB — RAD ONC ARIA SESSION SUMMARY
Course Elapsed Days: 3
Plan Fractions Treated to Date: 4
Plan Prescribed Dose Per Fraction: 5 Gy
Plan Total Fractions Prescribed: 10
Plan Total Prescribed Dose: 50 Gy
Reference Point Dosage Given to Date: 20 Gy
Reference Point Session Dosage Given: 5 Gy
Session Number: 4

## 2023-09-28 ENCOUNTER — Ambulatory Visit
Admission: RE | Admit: 2023-09-28 | Discharge: 2023-09-28 | Disposition: A | Discharge status: Home / Self Care | Source: Ambulatory Visit | Attending: Radiation Oncology

## 2023-09-28 ENCOUNTER — Other Ambulatory Visit: Payer: Self-pay

## 2023-09-28 DIAGNOSIS — Z510 Encounter for antineoplastic radiation therapy: Secondary | ICD-10-CM | POA: Diagnosis not present

## 2023-09-28 LAB — RAD ONC ARIA SESSION SUMMARY
Course Elapsed Days: 6
Plan Fractions Treated to Date: 5
Plan Prescribed Dose Per Fraction: 5 Gy
Plan Total Fractions Prescribed: 10
Plan Total Prescribed Dose: 50 Gy
Reference Point Dosage Given to Date: 25 Gy
Reference Point Session Dosage Given: 5 Gy
Session Number: 5

## 2023-09-29 ENCOUNTER — Other Ambulatory Visit: Payer: Self-pay

## 2023-09-29 ENCOUNTER — Ambulatory Visit
Admission: RE | Admit: 2023-09-29 | Discharge: 2023-09-29 | Disposition: A | Discharge status: Home / Self Care | Source: Ambulatory Visit | Attending: Radiation Oncology

## 2023-09-29 DIAGNOSIS — Z510 Encounter for antineoplastic radiation therapy: Secondary | ICD-10-CM | POA: Diagnosis not present

## 2023-09-29 LAB — RAD ONC ARIA SESSION SUMMARY
Course Elapsed Days: 7
Plan Fractions Treated to Date: 6
Plan Prescribed Dose Per Fraction: 5 Gy
Plan Total Fractions Prescribed: 10
Plan Total Prescribed Dose: 50 Gy
Reference Point Dosage Given to Date: 30 Gy
Reference Point Session Dosage Given: 5 Gy
Session Number: 6

## 2023-09-30 ENCOUNTER — Other Ambulatory Visit: Payer: Self-pay

## 2023-09-30 ENCOUNTER — Ambulatory Visit
Admission: RE | Admit: 2023-09-30 | Discharge: 2023-09-30 | Disposition: A | Discharge status: Home / Self Care | Source: Ambulatory Visit | Attending: Radiation Oncology | Admitting: Radiation Oncology

## 2023-09-30 DIAGNOSIS — Z510 Encounter for antineoplastic radiation therapy: Secondary | ICD-10-CM | POA: Diagnosis not present

## 2023-09-30 LAB — RAD ONC ARIA SESSION SUMMARY
Course Elapsed Days: 8
Plan Fractions Treated to Date: 7
Plan Prescribed Dose Per Fraction: 5 Gy
Plan Total Fractions Prescribed: 10
Plan Total Prescribed Dose: 50 Gy
Reference Point Dosage Given to Date: 35 Gy
Reference Point Session Dosage Given: 5 Gy
Session Number: 7

## 2023-10-01 ENCOUNTER — Ambulatory Visit
Admission: RE | Admit: 2023-10-01 | Discharge: 2023-10-01 | Disposition: A | Discharge status: Home / Self Care | Source: Ambulatory Visit | Attending: Radiation Oncology | Admitting: Radiation Oncology

## 2023-10-01 ENCOUNTER — Other Ambulatory Visit: Payer: Self-pay

## 2023-10-01 DIAGNOSIS — Z510 Encounter for antineoplastic radiation therapy: Secondary | ICD-10-CM | POA: Diagnosis not present

## 2023-10-01 LAB — RAD ONC ARIA SESSION SUMMARY
Course Elapsed Days: 9
Plan Fractions Treated to Date: 8
Plan Prescribed Dose Per Fraction: 5 Gy
Plan Total Fractions Prescribed: 10
Plan Total Prescribed Dose: 50 Gy
Reference Point Dosage Given to Date: 40 Gy
Reference Point Session Dosage Given: 5 Gy
Session Number: 8

## 2023-10-02 ENCOUNTER — Ambulatory Visit
Admission: RE | Admit: 2023-10-02 | Discharge: 2023-10-02 | Disposition: A | Discharge status: Home / Self Care | Source: Ambulatory Visit | Attending: Radiation Oncology | Admitting: Radiation Oncology

## 2023-10-02 ENCOUNTER — Other Ambulatory Visit: Payer: Self-pay

## 2023-10-02 DIAGNOSIS — Z510 Encounter for antineoplastic radiation therapy: Secondary | ICD-10-CM | POA: Diagnosis not present

## 2023-10-02 LAB — RAD ONC ARIA SESSION SUMMARY
Course Elapsed Days: 10
Plan Fractions Treated to Date: 9
Plan Prescribed Dose Per Fraction: 5 Gy
Plan Total Fractions Prescribed: 10
Plan Total Prescribed Dose: 50 Gy
Reference Point Dosage Given to Date: 45 Gy
Reference Point Session Dosage Given: 5 Gy
Session Number: 9

## 2023-10-05 ENCOUNTER — Other Ambulatory Visit: Payer: Self-pay

## 2023-10-05 ENCOUNTER — Ambulatory Visit
Admission: RE | Admit: 2023-10-05 | Discharge: 2023-10-05 | Disposition: A | Discharge status: Home / Self Care | Source: Ambulatory Visit | Attending: Radiation Oncology | Admitting: Radiation Oncology

## 2023-10-05 DIAGNOSIS — Z510 Encounter for antineoplastic radiation therapy: Secondary | ICD-10-CM | POA: Diagnosis not present

## 2023-10-05 LAB — RAD ONC ARIA SESSION SUMMARY
Course Elapsed Days: 13
Plan Fractions Treated to Date: 10
Plan Prescribed Dose Per Fraction: 5 Gy
Plan Total Fractions Prescribed: 10
Plan Total Prescribed Dose: 50 Gy
Reference Point Dosage Given to Date: 50 Gy
Reference Point Session Dosage Given: 5 Gy
Session Number: 10

## 2023-10-06 DIAGNOSIS — C61 Malignant neoplasm of prostate: Secondary | ICD-10-CM

## 2023-10-07 NOTE — Radiation Completion Notes (Signed)
 Patient Name: ASANI, CARDE MRN: [id number] Date of Birth: 1946/02/23 Referring Physician: Yevonne Heman, M.D. Date of Service: 2023-10-07 Radiation Oncologist: Bartholome Ligas, M.D. [facility]                             RADIATION ONCOLOGY END OF TREATMENT NOTE     Diagnosis: C61 Malignant neoplasm of prostate Staging on 2020-03-22: Primary malignant neoplasm of prostate (HCC) T=cT1c, N=cN0, M=cM0 Intent: Curative     ==========DELIVERED PLANS==========  First Treatment Date: 2023-09-22 Last Treatment Date: 2023-10-05   Plan Name: [id number] Site: Pelvis Technique: IMRT Mode: Photon Dose Per Fraction: 5 Gy Prescribed Dose (Delivered / Prescribed): 50 Gy / 50 Gy Prescribed Fxs (Delivered / Prescribed): 10 / 10     ==========ON TREATMENT VISIT DATES========== 2023-09-25, 2023-10-02     ==========UPCOMING VISITS========== 11/17/2023 [facility] [place] NEW PATIENT Trent Frizzle, MD        ==========APPENDIX - ON TREATMENT VISIT NOTES==========   See weekly On Treatment Notes in Epic for details in the Media tab (listed as Progress notes on the On Treatment Visit Dates listed above).

## 2023-11-15 NOTE — Progress Notes (Incomplete)
 11/17/2023 7:23 PM   Joshua [name] 12-19-1945 [id number]  Referring provider: Patrcia Cough, MD [address] [place],  [place] [address]  No chief complaint on file.   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   CYSTOSCOPY N/A 09/10/2020   Procedure: CYSTOSCOPY FLEXIBLE;  Surgeon: Matilda Senior, MD;  Location: [facility];  Service: Urology;  Laterality: N/A;  no seeds found in bladder   ESOPHAGOGASTRODUODENOSCOPY  2016   per pt had cauterization of bleeding ulcers   INJECTION KNEE Left 11/01/2019   Procedure: KNEE INJECTION;  Surgeon: Beverley Evalene BIRCH, MD;  Location: [facility];  Service: Orthopedics;  Laterality: Left;   RADICAL ORCHIECTOMY Left 1994   RADIOACTIVE SEED IMPLANT N/A 09/10/2020   Procedure: RADIOACTIVE SEED IMPLANT/BRACHYTHERAPY IMPLANT;  Surgeon: Matilda Senior, MD;  Location[facility];  Service: Urology;  Laterality: N/A;   72 seeds implanted   SPACE OAR INSTILLATION N/A 09/10/2020   Procedure: SPACE OAR INSTILLATION;  Surgeon: Matilda Senior, MD;  Location: [facility];  Service: Urology;  Laterality: N/A;   TONSILLECTOMY  child   TOTAL HIP ARTHROPLASTY  Right 11/01/2019   Procedure: TOTAL HIP ARTHROPLASTY ANTERIOR APPROACH;  Surgeon: Beverley Evalene BIRCH, MD;  Location: [facility];  Service: Orthopedics;  Laterality: Right;   TOTAL KNEE ARTHROPLASTY Left 12/27/2019   Procedure: TOTAL KNEE ARTHROPLASTY;  Surgeon: Beverley Evalene BIRCH, MD;  Location: [facility];  Service: Orthopedics;  Laterality: Left;    Home Medications:  Allergies as of 11/17/2023   No Known Allergies      Medication List        Accurate as of November 15, 2023  7:23 PM. If you have any questions, ask your nurse or doctor.          acetaminophen  325 MG tablet Commonly known as: TYLENOL  Take 1-2 tablets (325-650 mg total) by mouth every 6 (six) hours as needed for mild pain (pain score 1-3 or temp > 100.5).   amLODipine  10 MG tablet Commonly known as: NORVASC  Take 10 mg by mouth daily.   atorvastatin  20 MG tablet Commonly known as: LIPITOR Take 20 mg by mouth at bedtime.   B-12 2500 MCG Tabs Take by mouth daily.   diclofenac Sodium 1 % Gel Commonly known as: VOLTAREN Apply topically 4 (four) times daily as needed.   hydrochlorothiazide  25 MG tablet Commonly known as: HYDRODIURIL  Take 1 tablet by mouth daily.   levothyroxine  112 MCG tablet Commonly known as: SYNTHROID  Take 1 tablet by mouth daily.   losartan  100 MG tablet Commonly known as: COZAAR  Take 100 mg by  mouth daily.   Metoprolol  Succinate 100 MG Cs24 Take 50 mg by mouth daily.   pyridoxine 100 MG tablet Commonly known as: B-6 Take 1 tablet by mouth daily.        Allergies: No Known Allergies  Family History: Family History  Problem Relation Age of Onset   Breast cancer Mother 60   Prostate cancer Neg Hx    Pancreatic cancer Neg Hx    Colon cancer Neg Hx     Social History:  reports that he has never smoked. He has never used smokeless tobacco. He reports current alcohol use of about 2.0 standard drinks of alcohol per week. He reports that he does not use drugs.  ROS: All other review  of systems were reviewed and are negative except what is noted above in HPI  Physical Exam: There were no vitals taken for this visit.  Constitutional:  Alert and oriented, No acute distress. HEENT: [place] AT, moist mucus membranes.  Trachea midline, no masses. Cardiovascular: No clubbing, cyanosis, or edema. Respiratory: Normal respiratory effort, no increased work of breathing. GI: No inguinal hernias GU: Normal phallus. No masses/lesions on penis, testis, scrotum. Prostate ***g smooth no nodules no induration.  Lymph: No cervical or inguinal lymphadenopathy. Skin: No rashes, bruises or suspicious lesions. Neurologic: Grossly intact, no focal deficits, moving all 4 extremities. Psychiatric: Normal mood and affect.  Laboratory Data: Lab Results  Component Value Date   WBC 6.3 09/06/2020   HGB 14.1 09/06/2020   HCT 40.6 09/06/2020   MCV 89.4 09/06/2020   PLT 160 09/06/2020    Lab Results  Component Value Date   CREATININE 0.87 09/06/2020    No results found for: PSA  No results found for: TESTOSTERONE  No results found for: HGBA1C  Urinalysis   Pertinent Imaging: ***  Assessment & Plan:    There are no diagnoses linked to this encounter.  No follow-ups on file.  Garnette CHRISTELLA Shack, MD  [facility] [place]

## 2023-11-16 NOTE — Progress Notes (Signed)
 11/17/2023 1:19 PM   Joshua [name] January 18, 1946 [id number]  Referring provider: Patrcia Cough, MD [address] [place],  [place] [address]  No chief complaint on file.   HPI: 10.28.2021: TRUS/Bx for elevating PSA trend. PSA 6.06, prostate volume 39.6 mL, PSAD 0.15.  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   4.18.2022: I-125 brachytherapy, placement of Space OAR.   3.3.2023: PSA 0.92  IPSS 19 QoL score 3   1.31.2024: PSA 0.86. Still on alfuzosin. IPSS 11 QoL score 2. He is doing well since his last visit. He has not had blood in his urine or stool. He is satisfied with his alfuzosin. He showed me pictures of his [place] in [place] . [employer] [employer] passed through this area.      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   CYSTOSCOPY N/A 09/10/2020   Procedure: CYSTOSCOPY FLEXIBLE;  Surgeon: Matilda Senior, MD;  Location: [facility];  Service: Urology;  Laterality: N/A;  no seeds found in  bladder   ESOPHAGOGASTRODUODENOSCOPY  2016   per pt had cauterization of bleeding ulcers   INJECTION KNEE Left 11/01/2019   Procedure: KNEE INJECTION;  Surgeon: Beverley Evalene BIRCH, MD;  Location: [facility];  Service: Orthopedics;  Laterality: Left;   RADICAL ORCHIECTOMY Left 1994   RADIOACTIVE SEED IMPLANT N/A 09/10/2020   Procedure: RADIOACTIVE SEED IMPLANT/BRACHYTHERAPY IMPLANT;  Surgeon: Matilda Senior, MD;  Location: [facility];  Service: Urology;  Laterality: N/A;   72 seeds implanted   SPACE OAR INSTILLATION N/A 09/10/2020   Procedure: SPACE OAR INSTILLATION;  Surgeon: Matilda Senior, MD;  Location: [facility];  Service: Urology;  Laterality: N/A;   TONSILLECTOMY  child   TOTAL HIP ARTHROPLASTY Right 11/01/2019   Procedure: TOTAL HIP ARTHROPLASTY ANTERIOR APPROACH;  Surgeon: Beverley Evalene BIRCH, MD;  Location: [facility];  Service: Orthopedics;  Laterality: Right;   TOTAL KNEE ARTHROPLASTY Left 12/27/2019   Procedure: TOTAL KNEE ARTHROPLASTY;  Surgeon: Beverley Evalene BIRCH, MD;  Location: [facility];  Service: Orthopedics;  Laterality: Left;    Home Medications:  Allergies as of 11/17/2023   No Known Allergies      Medication List        Accurate as of November 16, 2023  1:19 PM. If you have any questions, ask your nurse or doctor.  acetaminophen  325 MG tablet Commonly known as: TYLENOL  Take 1-2 tablets (325-650 mg total) by mouth every 6 (six) hours as needed for mild pain (pain score 1-3 or temp > 100.5).   amLODipine  10 MG tablet Commonly known as: NORVASC  Take 10 mg by mouth daily.   atorvastatin  20 MG tablet Commonly known as: LIPITOR Take 20 mg by mouth at bedtime.   B-12 2500 MCG Tabs Take by mouth daily.   diclofenac Sodium 1 % Gel Commonly known as: VOLTAREN Apply topically 4 (four) times daily as needed.   hydrochlorothiazide  25 MG tablet Commonly known as: HYDRODIURIL  Take 1 tablet by mouth daily.   levothyroxine  112 MCG  tablet Commonly known as: SYNTHROID  Take 1 tablet by mouth daily.   losartan  100 MG tablet Commonly known as: COZAAR  Take 100 mg by mouth daily.   Metoprolol  Succinate 100 MG Cs24 Take 50 mg by mouth daily.   pyridoxine 100 MG tablet Commonly known as: B-6 Take 1 tablet by mouth daily.        Allergies: No Known Allergies  Family History: Family History  Problem Relation Age of Onset   Breast cancer Mother 35   Prostate cancer Neg Hx    Pancreatic cancer Neg Hx    Colon cancer Neg Hx     Social History:  reports that he has never smoked. He has never used smokeless tobacco. He reports current alcohol use of about 2.0 standard drinks of alcohol per week. He reports that he does not use drugs.  ROS: All other review of systems were reviewed and are negative except what is noted above in HPI  Physical Exam: There were no vitals taken for this visit.  Constitutional:  Alert and oriented, No acute distress. HEENT: [place] AT, moist mucus membranes.  Trachea midline, no masses. Cardiovascular: No clubbing, cyanosis, or edema. Respiratory: Normal respiratory effort, no increased work of breathing. GI: No inguinal hernias GU: Normal phallus. No masses/lesions on penis, testis, scrotum. Prostate ***g smooth no nodules no induration.  Lymph: No cervical or inguinal lymphadenopathy. Skin: No rashes, bruises or suspicious lesions. Neurologic: Grossly intact, no focal deficits, moving all 4 extremities. Psychiatric: Normal mood and affect.  Laboratory Data: Lab Results  Component Value Date   WBC 6.3 09/06/2020   HGB 14.1 09/06/2020   HCT 40.6 09/06/2020   MCV 89.4 09/06/2020   PLT 160 09/06/2020    Lab Results  Component Value Date   CREATININE 0.87 09/06/2020    No results found for: PSA  No results found for: TESTOSTERONE  No results found for: HGBA1C  Urinalysis   Pertinent Imaging: ***  Assessment & Plan:    1. Primary malignant neoplasm of  prostate (HCC) (Primary) ***  2. 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  Garnette CHRISTELLA Shack, MD  [facility] 

## 2023-11-17 ENCOUNTER — Ambulatory Visit: Admitting: Urology

## 2023-11-17 VITALS — BP 120/74 | HR 68

## 2023-11-17 DIAGNOSIS — C61 Malignant neoplasm of prostate: Secondary | ICD-10-CM

## 2023-11-17 DIAGNOSIS — R9721 Rising PSA following treatment for malignant neoplasm of prostate: Secondary | ICD-10-CM | POA: Diagnosis not present

## 2023-11-17 DIAGNOSIS — N401 Enlarged prostate with lower urinary tract symptoms: Secondary | ICD-10-CM

## 2023-11-17 LAB — URINALYSIS, ROUTINE W REFLEX MICROSCOPIC
Bilirubin, UA: NEGATIVE
Glucose, UA: NEGATIVE
Ketones, UA: NEGATIVE
Leukocytes,UA: NEGATIVE
Nitrite, UA: NEGATIVE
Protein,UA: NEGATIVE
RBC, UA: NEGATIVE
Specific Gravity, UA: 1.02 (ref 1.005–1.030)
Urobilinogen, Ur: 0.2 mg/dL (ref 0.2–1.0)
pH, UA: 6 (ref 5.0–7.5)

## 2024-01-11 ENCOUNTER — Telehealth: Payer: Self-pay | Admitting: Urology

## 2024-01-11 NOTE — Telephone Encounter (Signed)
 Had seed implant and also Had 10 days of radiation and is having a reaction, having nerve reactions wants to see Dr D before September. He said he felt sure he would squeeze him in earlier.

## 2024-01-11 NOTE — Telephone Encounter (Signed)
Returned call with no answer. Message left to return call. 

## 2024-01-14 NOTE — Telephone Encounter (Signed)
 Patient called with no answer. Message left with request to call office back if needed.

## 2024-02-22 NOTE — Progress Notes (Signed)
 Assessment:  Grade group 3 prostate cancer, status post I-125 brachytherapy in April, 2022.  Recurrence detected in April of this year, he has completed radiation to his oligometastatic lymph node recurrence.  He is doing well, PSA checked 3 months ago was 0.34  BPH, symptoms not terribly bothersome  Plan: PSA is checked today  I will have him come back in 3 months for recheck  HPI: 10.28.2021: TRUS/Bx for elevating PSA trend. PSA 6.06, prostate volume 39.6 mL, PSAD 0.15.  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   4.18.2022: I-125 brachytherapy, placement of Space OAR.   3.3.2023: PSA 0.92  IPSS 19 QoL score 3   1.31.2024: PSA 0.86.   7.29.2024: PSA 1.0  4.4.2025: Referred to Dr. Patrcia at [facility].  The patient had a subsequent increase in PSA to 3.67.  Initially scheduled for repeat prostate biopsy.  However, underwent PSMA PET scan on 09/04/2023.  Findings: 1. Radiotracer avid RIGHT operator node consistent with solitary nodal metastasis. 2. No evidence of local prostate carcinoma recurrence in the prostate gland. 3. No evidence of visceral metastasis or skeletal metastasis. He then completed SBRT of the oligometastatic lymph node, 10 fractions, 50 Gy in total.  He completed treatment on 5.9.2025.  6.24.2025:+ First checkup since his SBRT completed.  He had 1 or 2 days of loose stools, otherwise he has had no significant side effects.  No blood in his urine or stool.  9.30.2025: Here for routine check.  He has had some problems with disability coverage from the [place] which has been cleared up, apparently.  No significant lower urinary tract symptoms.  Having right sided sciatica.  Most recent PSA from June 30 of this year 0.34.  No blood in his  urine.  Overall, no complaints specifically related to his prostate.  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   CYSTOSCOPY N/A 09/10/2020   Procedure: CYSTOSCOPY FLEXIBLE;  Surgeon: Matilda Senior, MD;  Location: [facility];  Service: Urology;  Laterality: N/A;  no seeds found in bladder   ESOPHAGOGASTRODUODENOSCOPY  2016   per pt had cauterization of bleeding ulcers   INJECTION KNEE Left 11/01/2019   Procedure: KNEE INJECTION;  Surgeon: Beverley Evalene BIRCH, MD;  Location[facility];  Service: Orthopedics;  Laterality: Left;   RADICAL ORCHIECTOMY Left 1994   RADIOACTIVE SEED IMPLANT N/A 09/10/2020  Procedure: RADIOACTIVE SEED IMPLANT/BRACHYTHERAPY IMPLANT;  Surgeon: Matilda Senior, MD;  Location: [facility];  Service: Urology;  Laterality: N/A;   72 seeds implanted   SPACE OAR INSTILLATION N/A 09/10/2020   Procedure: SPACE OAR INSTILLATION;  Surgeon: Matilda Senior, MD;  Location: [facility];  Service: Urology;  Laterality: N/A;   TONSILLECTOMY  child   TOTAL HIP ARTHROPLASTY Right 11/01/2019   Procedure: TOTAL HIP ARTHROPLASTY ANTERIOR APPROACH;  Surgeon: Beverley Evalene BIRCH, MD;  Location: [facility];  Service:  Orthopedics;  Laterality: Right;   TOTAL KNEE ARTHROPLASTY Left 12/27/2019   Procedure: TOTAL KNEE ARTHROPLASTY;  Surgeon: Beverley Evalene BIRCH, MD;  Location: [facility];  Service: Orthopedics;  Laterality: Left;    Home Medications:  Allergies as of 02/23/2024   No Known Allergies      Medication List        Accurate as of February 22, 2024  7:01 AM. If you have any questions, ask your nurse or doctor.          acetaminophen  325 MG tablet Commonly known as: TYLENOL  Take 1-2 tablets (325-650 mg total) by mouth every 6 (six) hours as needed for mild pain (pain score 1-3 or temp > 100.5).   amLODipine  10 MG tablet Commonly known as: NORVASC  Take 10 mg by mouth daily.   atorvastatin  20 MG tablet Commonly known as: LIPITOR Take 20 mg by mouth at bedtime.   B-12 2500 MCG Tabs Take by mouth daily.   diclofenac Sodium 1 % Gel Commonly known as: VOLTAREN Apply topically 4 (four) times daily as needed.   hydrochlorothiazide  25 MG tablet Commonly known as: HYDRODIURIL  Take 1 tablet by mouth daily.   levothyroxine  112 MCG tablet Commonly known as: SYNTHROID  Take 1 tablet by mouth daily.   losartan  100 MG tablet Commonly known as: COZAAR  Take 100 mg by mouth daily.   Metoprolol  Succinate 100 MG Cs24 Take 50 mg by mouth daily.   pyridoxine 100 MG tablet Commonly known as: B-6 Take 1 tablet by mouth daily.        Allergies: No Known Allergies  Family History: Family History  Problem Relation Age of Onset   Breast cancer Mother 77   Prostate cancer Neg Hx    Pancreatic cancer Neg Hx    Colon cancer Neg Hx     Social History:  reports that he has never smoked. He has never used smokeless tobacco. He reports current alcohol use of about 2.0 standard drinks of alcohol per week. He reports that he does not use drugs.  ROS: All other review of systems were reviewed and are negative except what is noted above in HPI  Physical Exam: There were no vitals taken for  this visit.  Constitutional:  Alert and oriented, No acute distress. HEENT: [place] AT, moist mucus membranes.  Trachea midline, no masses. Cardiovascular: No clubbing, cyanosis, or edema. Respiratory: Normal respiratory effort, no increased work of breathing. Skin: No rashes, bruises or suspicious lesions. Neurologic: Grossly intact, no focal deficits, moving all 4 extremities. Psychiatric: Normal mood and affect.  Laboratory Data: Lab Results  Component Value Date   WBC 6.3 09/06/2020   HGB 14.1 09/06/2020   HCT 40.6 09/06/2020   MCV 89.4 09/06/2020   PLT 160 09/06/2020    Lab Results  Component Value Date   CREATININE 0.87 09/06/2020   IPSS score reviewed-7  PET scan reviewed  Initial pathology/PSA reviewed  All PSA data reviewed, including most recent PSA from June  of this year  Urinalysis--clear

## 2024-02-23 ENCOUNTER — Encounter: Payer: Self-pay | Admitting: Urology

## 2024-02-23 ENCOUNTER — Ambulatory Visit (INDEPENDENT_AMBULATORY_CARE_PROVIDER_SITE_OTHER): Admitting: Urology

## 2024-02-23 VITALS — BP 143/86 | HR 73

## 2024-02-23 DIAGNOSIS — N401 Enlarged prostate with lower urinary tract symptoms: Secondary | ICD-10-CM | POA: Diagnosis not present

## 2024-02-23 DIAGNOSIS — C61 Malignant neoplasm of prostate: Secondary | ICD-10-CM

## 2024-02-23 DIAGNOSIS — R9721 Rising PSA following treatment for malignant neoplasm of prostate: Secondary | ICD-10-CM

## 2024-02-23 LAB — MICROSCOPIC EXAMINATION
Bacteria, UA: NONE SEEN
RBC, Urine: NONE SEEN /HPF (ref 0–2)

## 2024-02-23 LAB — URINALYSIS, ROUTINE W REFLEX MICROSCOPIC
Bilirubin, UA: NEGATIVE
Glucose, UA: NEGATIVE
Ketones, UA: NEGATIVE
Leukocytes,UA: NEGATIVE
Nitrite, UA: NEGATIVE
RBC, UA: NEGATIVE
Specific Gravity, UA: 1.025 (ref 1.005–1.030)
Urobilinogen, Ur: 0.2 mg/dL (ref 0.2–1.0)
pH, UA: 5.5 (ref 5.0–7.5)

## 2024-02-24 ENCOUNTER — Ambulatory Visit: Payer: Self-pay

## 2024-02-24 LAB — PSA: Prostate Specific Ag, Serum: 0.2 ng/mL (ref 0.0–4.0)

## 2024-02-24 NOTE — Telephone Encounter (Signed)
-----   Message from Garnette HERO Dahlstedt sent at 02/24/2024  1:18 PM EDT ----- Please call pt--good news psa now 0.2 ----- Message ----- From: Interface, [facility] Lab Results In Sent: 02/23/2024   3:36 PM EDT To: Garnette Shack, MD

## 2024-02-24 NOTE — Telephone Encounter (Signed)
 Called pt to let him know PSA results per MD Dahlstedt pt said thank you

## 2024-07-19 ENCOUNTER — Ambulatory Visit: Admitting: Urology
